# Patient Record
Sex: Male | Born: 1955 | Race: White | Hispanic: No | Marital: Single | Smoking: Never smoker
Health system: Southern US, Community
[De-identification: ages and names within clinical notes are randomized; demographics above are authoritative.]

## PROBLEM LIST (undated history)

## (undated) DIAGNOSIS — K219 Gastro-esophageal reflux disease without esophagitis: Secondary | ICD-10-CM

## (undated) DIAGNOSIS — M5416 Radiculopathy, lumbar region: Secondary | ICD-10-CM

## (undated) DIAGNOSIS — E039 Hypothyroidism, unspecified: Secondary | ICD-10-CM

## (undated) DIAGNOSIS — F329 Major depressive disorder, single episode, unspecified: Secondary | ICD-10-CM

## (undated) DIAGNOSIS — I10 Essential (primary) hypertension: Secondary | ICD-10-CM

## (undated) DIAGNOSIS — G40909 Epilepsy, unspecified, not intractable, without status epilepticus: Secondary | ICD-10-CM

## (undated) DIAGNOSIS — I872 Venous insufficiency (chronic) (peripheral): Secondary | ICD-10-CM

## (undated) DIAGNOSIS — E119 Type 2 diabetes mellitus without complications: Secondary | ICD-10-CM

## (undated) DIAGNOSIS — M419 Scoliosis, unspecified: Secondary | ICD-10-CM

## (undated) DIAGNOSIS — M791 Myalgia, unspecified site: Secondary | ICD-10-CM

## (undated) DIAGNOSIS — F32A Depression, unspecified: Secondary | ICD-10-CM

## (undated) DIAGNOSIS — M1990 Unspecified osteoarthritis, unspecified site: Secondary | ICD-10-CM

## (undated) DIAGNOSIS — F419 Anxiety disorder, unspecified: Secondary | ICD-10-CM

## (undated) DIAGNOSIS — G4730 Sleep apnea, unspecified: Secondary | ICD-10-CM

## (undated) DIAGNOSIS — E876 Hypokalemia: Secondary | ICD-10-CM

## (undated) DIAGNOSIS — R0602 Shortness of breath: Secondary | ICD-10-CM

## (undated) DIAGNOSIS — R0600 Dyspnea, unspecified: Secondary | ICD-10-CM

## (undated) DIAGNOSIS — R748 Abnormal levels of other serum enzymes: Secondary | ICD-10-CM

## (undated) HISTORY — DX: Unspecified osteoarthritis, unspecified site: M19.90

## (undated) HISTORY — DX: Shortness of breath: R06.02

## (undated) HISTORY — DX: Myalgia, unspecified site: M79.10

## (undated) HISTORY — DX: Hypothyroidism, unspecified: E03.9

## (undated) HISTORY — DX: Gastro-esophageal reflux disease without esophagitis: K21.9

## (undated) HISTORY — DX: Abnormal levels of other serum enzymes: R74.8

## (undated) HISTORY — PX: KNEE SURGERY: SHX244

## (undated) HISTORY — DX: Scoliosis, unspecified: M41.9

## (undated) HISTORY — DX: Depression, unspecified: F32.A

## (undated) HISTORY — DX: Major depressive disorder, single episode, unspecified: F32.9

## (undated) HISTORY — DX: Essential (primary) hypertension: I10

## (undated) HISTORY — DX: Morbid (severe) obesity due to excess calories: E66.01

## (undated) HISTORY — DX: Radiculopathy, lumbar region: M54.16

## (undated) HISTORY — DX: Hypokalemia: E87.6

## (undated) HISTORY — PX: TONSILLECTOMY: SUR1361

## (undated) HISTORY — PX: VENTRAL HERNIA REPAIR: SHX424

## (undated) HISTORY — DX: Venous insufficiency (chronic) (peripheral): I87.2

## (undated) HISTORY — PX: BRAIN SURGERY: SHX531

## (undated) HISTORY — DX: Epilepsy, unspecified, not intractable, without status epilepticus: G40.909

## (undated) HISTORY — DX: Sleep apnea, unspecified: G47.30

---

## 2000-07-07 DIAGNOSIS — J309 Allergic rhinitis, unspecified: Secondary | ICD-10-CM | POA: Insufficient documentation

## 2002-07-28 DIAGNOSIS — M5137 Other intervertebral disc degeneration, lumbosacral region: Secondary | ICD-10-CM | POA: Insufficient documentation

## 2002-07-28 DIAGNOSIS — M4120 Other idiopathic scoliosis, site unspecified: Secondary | ICD-10-CM | POA: Insufficient documentation

## 2002-07-28 DIAGNOSIS — M51379 Other intervertebral disc degeneration, lumbosacral region without mention of lumbar back pain or lower extremity pain: Secondary | ICD-10-CM | POA: Insufficient documentation

## 2002-08-26 DIAGNOSIS — F419 Anxiety disorder, unspecified: Secondary | ICD-10-CM | POA: Insufficient documentation

## 2004-07-30 DIAGNOSIS — R748 Abnormal levels of other serum enzymes: Secondary | ICD-10-CM | POA: Insufficient documentation

## 2004-08-13 DIAGNOSIS — E876 Hypokalemia: Secondary | ICD-10-CM | POA: Insufficient documentation

## 2007-10-20 DIAGNOSIS — I872 Venous insufficiency (chronic) (peripheral): Secondary | ICD-10-CM | POA: Insufficient documentation

## 2008-03-18 DIAGNOSIS — R109 Unspecified abdominal pain: Secondary | ICD-10-CM | POA: Insufficient documentation

## 2008-10-18 ENCOUNTER — Encounter: Admission: RE | Admit: 2008-10-18 | Discharge: 2008-10-18 | Payer: Self-pay | Admitting: Podiatry

## 2009-02-01 DIAGNOSIS — M7750 Other enthesopathy of unspecified foot: Secondary | ICD-10-CM | POA: Insufficient documentation

## 2009-08-31 DIAGNOSIS — Z8719 Personal history of other diseases of the digestive system: Secondary | ICD-10-CM | POA: Insufficient documentation

## 2009-09-04 DIAGNOSIS — E349 Endocrine disorder, unspecified: Secondary | ICD-10-CM | POA: Insufficient documentation

## 2009-09-29 DIAGNOSIS — M2550 Pain in unspecified joint: Secondary | ICD-10-CM | POA: Insufficient documentation

## 2009-10-26 DIAGNOSIS — M797 Fibromyalgia: Secondary | ICD-10-CM | POA: Insufficient documentation

## 2010-01-22 ENCOUNTER — Encounter: Admission: RE | Admit: 2010-01-22 | Discharge: 2010-01-22 | Payer: Self-pay | Admitting: Rheumatology

## 2010-02-19 ENCOUNTER — Encounter: Admission: RE | Admit: 2010-02-19 | Discharge: 2010-02-19 | Payer: Self-pay | Admitting: Rheumatology

## 2010-03-12 ENCOUNTER — Encounter: Admission: RE | Admit: 2010-03-12 | Discharge: 2010-03-12 | Payer: Self-pay | Admitting: Rheumatology

## 2010-03-16 DIAGNOSIS — G8929 Other chronic pain: Secondary | ICD-10-CM | POA: Insufficient documentation

## 2010-03-23 ENCOUNTER — Encounter: Payer: Self-pay | Admitting: Pulmonary Disease

## 2010-04-18 DIAGNOSIS — E039 Hypothyroidism, unspecified: Secondary | ICD-10-CM | POA: Insufficient documentation

## 2010-04-19 ENCOUNTER — Ambulatory Visit: Payer: Self-pay | Admitting: Pulmonary Disease

## 2010-04-19 DIAGNOSIS — R030 Elevated blood-pressure reading, without diagnosis of hypertension: Secondary | ICD-10-CM | POA: Insufficient documentation

## 2010-04-19 DIAGNOSIS — G4733 Obstructive sleep apnea (adult) (pediatric): Secondary | ICD-10-CM | POA: Insufficient documentation

## 2010-05-08 ENCOUNTER — Telehealth: Payer: Self-pay | Admitting: Pulmonary Disease

## 2010-05-29 ENCOUNTER — Encounter
Admission: RE | Admit: 2010-05-29 | Discharge: 2010-05-29 | Payer: Self-pay | Source: Home / Self Care | Attending: Rheumatology | Admitting: Rheumatology

## 2010-06-24 ENCOUNTER — Encounter: Payer: Self-pay | Admitting: Rheumatology

## 2010-07-03 NOTE — Progress Notes (Signed)
Summary: Dr Isidore Moos needing RX faxed  Phone Note From Other Clinic   Caller: Dr. Althea Grimmer office Call For: sood Summary of Call: Office called-they need RX for oral appliance faxed to them at [phone] sheet in EMR will not work). Office number to call if any questions [phone]. Pt has appt on 05-14-10 at 10:50am.  Initial call taken by: Reynaldo Minium CMA,  May 08, 2010 12:48 PM  Follow-up for Phone Call        they stated they rec. EMR order sheet but they require a written order for ins purpose. Follow-up by: Philipp Deputy CMA,  May 08, 2010 3:50 PM  Additional Follow-up for Phone Call Additional follow up Details #1::        Referral pulled out and re-faxed to Dr. Myrtis Ser along with ov notes and sleep study to [phone]. Alfonso Ramus  May 08, 2010 5:45 PM

## 2010-07-03 NOTE — Assessment & Plan Note (Signed)
Summary: PER [facility]/SLEEP CON/[facility]   Visit Type:  Initial Consult Copy to:  Leodis Sias, M.D. Primary Provider/Referring Provider:  Dr. Kathlee Nations in [place]  CC:  Sleep consult.Marland KitchenMarland KitchenEpworth score is 2..  History of Present Illness: 55 yo male referred for evaluation of sleep apnea.  He had a sleep test in 2003, and was told he had sleep apnea.  He was successful in losing weight, and his sleep apnea improved.  He has since regained his weight, and he started getting more trouble with his sleep again.  He had a home sleep test on October 19 through October 21, and this showed moderate sleep apnea.  He works second shift.  He goes to bed at 1am, and falls asleep quickly.  He has more trouble sleeping on his back.  If he sleeps with his head elevated this helps.  He wakes up to use the bathroom once per night.  He gets out of bed at 11am.  He will get a choking sensation when he sleeps flat.  He feels okay in the morning, and denies headaches.  He does get back and right leg pain which sometimes causes trouble with his sleep.  He does not use anything to help him sleep or stay awake.  He denies sleep hallucinations, sleep paralysis, or cataplexy.  There is no history of sleep walking, sleep talking, nightmares, or bruxism.  He occasional feels depressed, and is on medication for this.  Echo from September 14, 2009 showed mild LVH, mild pulmonary hypertension.  Preventive Screening-Counseling & Management  Alcohol-Tobacco     Smoking Status: never  Current Medications (verified): 1)  Diclofenac Sodium 75 Mg Tbec (Diclofenac Sodium) .Marland Kitchen.. 1 By Mouth Three Times A Day As Needed 2)  Oxcarbazepine 300 Mg Tabs (Oxcarbazepine) .Marland Kitchen.. 1 By Mouth Two Times A Day 3)  Levothyroxine Sodium 75 Mcg Tabs (Levothyroxine Sodium) .Marland Kitchen.. 1 By Mouth Daily 4)  Androgel 25 Mg/2.5gm Gel (Testosterone) .... As Directed Once Daily 5)  Hydrocodone-Acetaminophen 5-325 Mg Tabs (Hydrocodone-Acetaminophen) .Marland Kitchen.. 1 By  Mouth Two Times A Day As Needed 6)  Sertraline Hcl 50 Mg Tabs (Sertraline Hcl) .Marland Kitchen.. 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   Review of Systems       The patient complains of shortness of breath with activity, weight change, anxiety, depression, and joint stiffness or pain.  The patient denies shortness of breath at rest, productive cough, non-productive cough, coughing up blood, chest pain, irregular heartbeats, acid heartburn, indigestion, loss of appetite, abdominal pain, difficulty swallowing, sore throat, tooth/dental problems, headaches, nasal congestion/difficulty breathing through nose, sneezing, itching, ear ache, hand/feet swelling, rash, change in color of mucus, and fever.    Vital Signs:  Patient profile:   55 year old male Height:      69 inches (175.26 cm) Weight:      259 pounds (117.73 kg) BMI:     38.39 O2 Sat:      97 % on Room air Temp:     98.1 degrees F (36.72 degrees C) oral Pulse rate:   58 / minute BP sitting:   174 / 82 Cuff size:   large  Vitals Entered By: Michel Bickers CMA (April 19, 2010 3:35 PM)  O2 Sat at Rest %:  97 O2 Flow:  Room air CC: Sleep consult.Marland KitchenMarland KitchenEpworth score is 2. Comments Medications reviewed with patient Michel Bickers [facility]  April 19, 2010 3:36 PM   Physical Exam  General:  healthy appearing and obese.   Eyes:  PERRLA and EOMI.   Nose:  no deformity, discharge, inflammation, or lesions Mouth:  MP 3, no exudate Neck:  no JVD.   Lungs:  clear bilaterally to auscultation and percussion Heart:  regular rate and rhythm, S1, S2 without murmurs, rubs, gallops, or clicks Abdomen:  bowel sounds positive; abdomen soft and  non-tender without masses, or organomegaly Pulses:  pulses normal Extremities:  no clubbing, cyanosis, edema, or deformity noted Neurologic:  normal CN II-XII and strength normal.   Cervical Nodes:  no significant adenopathy Psych:  alert and cooperative; normal mood and affect; normal attention span and concentration   Impression & Recommendations:  Problem # 1:  OBSTRUCTIVE SLEEP APNEA (ICD-327.23) He has moderate sleep apnea. He has sleep disruption, daytime sleepiness, and history of depression.  I explained how sleep apnea can affect his health.  Driving precautions and need for weight loss were reviewed.  Discussed treatment options for sleep apnea.  He has tried CPAP before, but was not able to adjust to using this.    He therefore arrange for evaluation by Dr. Althea Grimmer to assess for an oral appliance to treat his obstructive sleep apnea.  I explained that he will need follow up sleep testing once his device is fitted.  Problem # 2:  ELEVATED BLOOD PRESSURE (ICD-796.2) He had elevated blood pressure today, and his recent echo showed mild LVH.  Will defer to cardiology and primary care whether medication is required to control his blood pressure.  Medications Added to Medication List This Visit: 1)  Diclofenac Sodium 75 Mg Tbec (Diclofenac sodium) .Marland Kitchen.. 1 by mouth three times a day as needed 2)  Oxcarbazepine 300 Mg Tabs (Oxcarbazepine) .Marland Kitchen.. 1 by mouth two times a day 3)  Levothyroxine Sodium 75 Mcg Tabs (Levothyroxine sodium) .Marland Kitchen.. 1 by mouth daily 4)  Androgel 25 Mg/2.5gm Gel (Testosterone) .... As directed once daily 5)  Hydrocodone-acetaminophen 5-325 Mg Tabs (Hydrocodone-acetaminophen) .Marland Kitchen.. 1 by mouth two times a day as needed 6)  Sertraline Hcl 50 Mg Tabs (Sertraline hcl) .Marland Kitchen.. 1 by mouth daily  Complete Medication List: 1)  Diclofenac Sodium 75 Mg Tbec (Diclofenac sodium) .Marland Kitchen.. 1 by mouth three times a day as needed 2)  Oxcarbazepine 300 Mg Tabs (Oxcarbazepine) .Marland Kitchen.. 1 by mouth  two times a day 3)  Levothyroxine Sodium 75 Mcg Tabs (Levothyroxine sodium) .Marland Kitchen.. 1 by mouth daily 4)  Androgel 25 Mg/2.5gm Gel (Testosterone) .... As directed once daily 5)  Hydrocodone-acetaminophen 5-325 Mg Tabs (Hydrocodone-acetaminophen) .Marland Kitchen.. 1 by mouth two times a day as needed 6)  Sertraline Hcl 50 Mg Tabs (Sertraline hcl) .Marland Kitchen.. 1 by mouth daily  Other Orders: Consultation Level IV (40102) Dental Referral (Dentist)  Patient Instructions: 1)  Will arrange for dental evaluation to fit for oral appliance for sleep apnea 2)  Follow up in 3 to 4 months

## 2010-10-22 ENCOUNTER — Other Ambulatory Visit: Payer: Self-pay | Admitting: Rheumatology

## 2010-10-22 DIAGNOSIS — M5410 Radiculopathy, site unspecified: Secondary | ICD-10-CM

## 2010-11-05 ENCOUNTER — Other Ambulatory Visit: Payer: Self-pay

## 2010-11-08 ENCOUNTER — Ambulatory Visit
Admission: RE | Admit: 2010-11-08 | Discharge: 2010-11-08 | Disposition: A | Discharge status: Home / Self Care | Payer: BC Managed Care – PPO | Source: Ambulatory Visit | Attending: Rheumatology | Admitting: Rheumatology

## 2010-11-08 DIAGNOSIS — M5410 Radiculopathy, site unspecified: Secondary | ICD-10-CM

## 2011-01-24 ENCOUNTER — Other Ambulatory Visit: Payer: Self-pay | Admitting: Rheumatology

## 2011-01-24 DIAGNOSIS — M5410 Radiculopathy, site unspecified: Secondary | ICD-10-CM

## 2011-01-31 ENCOUNTER — Other Ambulatory Visit: Payer: BC Managed Care – PPO

## 2011-02-11 ENCOUNTER — Other Ambulatory Visit: Payer: BC Managed Care – PPO

## 2011-02-14 ENCOUNTER — Other Ambulatory Visit: Payer: BC Managed Care – PPO

## 2011-07-01 ENCOUNTER — Inpatient Hospital Stay: Admission: RE | Admit: 2011-07-01 | Payer: BC Managed Care – PPO | Source: Ambulatory Visit

## 2011-07-11 ENCOUNTER — Ambulatory Visit
Admission: RE | Admit: 2011-07-11 | Discharge: 2011-07-11 | Disposition: A | Discharge status: Home / Self Care | Payer: BC Managed Care – PPO | Source: Ambulatory Visit | Attending: Rheumatology | Admitting: Rheumatology

## 2011-07-11 DIAGNOSIS — M5410 Radiculopathy, site unspecified: Secondary | ICD-10-CM

## 2011-07-15 ENCOUNTER — Other Ambulatory Visit: Payer: BC Managed Care – PPO

## 2011-09-09 ENCOUNTER — Encounter: Payer: Self-pay | Admitting: Cardiovascular Disease

## 2011-09-26 ENCOUNTER — Encounter: Payer: Self-pay | Admitting: *Deleted

## 2011-09-27 ENCOUNTER — Encounter: Payer: Self-pay | Admitting: Cardiovascular Disease

## 2011-09-27 ENCOUNTER — Ambulatory Visit (INDEPENDENT_AMBULATORY_CARE_PROVIDER_SITE_OTHER): Payer: BC Managed Care – PPO | Admitting: Cardiovascular Disease

## 2011-09-27 VITALS — BP 132/70 | HR 60 | Ht 69.0 in | Wt 260.1 lb

## 2011-09-27 DIAGNOSIS — R030 Elevated blood-pressure reading, without diagnosis of hypertension: Secondary | ICD-10-CM

## 2011-09-27 DIAGNOSIS — G4733 Obstructive sleep apnea (adult) (pediatric): Secondary | ICD-10-CM

## 2011-09-27 NOTE — Assessment & Plan Note (Signed)
His blood pressure is fairly well controlled at present. I've encouraged him to work on getting his sleep apnea corrected. This will certainly help with his blood pressure. It will also help with his fibromyalgia pain. I've asked him to work on a good diet and exercise program.

## 2011-09-27 NOTE — Progress Notes (Signed)
    Isaac Lozano Date of Birth  1955-07-04 [facility]     [place]  [address]    [address]   [address] [place], [place]  [address]    [place], [place]  [address] [phone]  Fax  [phone]  [phone]  Fax [phone]  Problems 1. Hypothyroidism 2. Sleep apnea- does not wear CPAP 3. dysnea 4. Obesity 5. "fibromyalgia"   History of Present Illness:  He presents with cp - typically when he bends over..  He has total body aches - c/w fibromyalgia symptoms.  He walks on occasion and has typical muscular soreness. He does not have any angina when he walks.  He has sleep apnea but does not tolerate his CPAP mask. He is looking to get one of the mouthpieces that will help to his jaw forward.   Current Outpatient Prescriptions on File Prior to Visit  Medication Sig Dispense Refill  . B Complex Vitamins (VITAMIN B COMPLEX PO) Take by mouth daily.      . Cholecalciferol (VITAMIN D PO) Take by mouth daily.      Marland Kitchen DICLOFENAC POTASSIUM PO Take by mouth as needed.      Marland Kitchen levothyroxine (SYNTHROID, LEVOTHROID) 75 MCG tablet Take 75 mcg by mouth daily.      Marland Kitchen omeprazole (PRILOSEC) 20 MG capsule Take 20 mg by mouth daily.      . Oxcarbazepine (TRILEPTAL) 300 MG tablet       . sertraline (ZOLOFT) 50 MG tablet         No Known Allergies  Past Medical History  Diagnosis Date  . SOB (shortness of breath)     Chronic   . Elevated CPK   . Muscle pain     Chronic  . Hypothyroidism   . Sleep apnea   . Depression   . HTN (hypertension)   . Seizure disorder   . Scoliosis   . GERD (gastroesophageal reflux disease)   . Hypokalemia   . Low testosterone   . Venous insufficiency     Past Surgical History  Procedure Date  . Knee surgery     Left  . Brain surgery   . Ventral hernia repair     History  Smoking status  . Never Smoker   Smokeless tobacco  . Not on file    History  Alcohol Use No    No family history on file.  Reviw of  Systems:  Reviewed in the HPI.  All other systems are negative.  Physical Exam: Blood pressure 132/70, pulse 60, height 5\' 9"  (1.753 m), weight 260 lb 1.9 oz (117.99 kg). General: Well developed, well nourished, in no acute distress.  Head: Normocephalic, atraumatic, sclera non-icteric, mucus membranes are moist,   Neck: Supple. Carotids are 2 + without bruits. No JVD  Lungs: Clear bilaterally to auscultation.  Heart: regular rate.  normal  S1 S2. No murmurs, gallops or rubs.  Abdomen: Soft, non-tender, non-distended with normal bowel sounds. No hepatomegaly. No rebound/guarding. No masses.  He is moderately obese.  Msk:  Strength and tone are normal  Extremities: No clubbing or cyanosis. No edema.  Distal pedal pulses are 2+ and equal bilaterally.  Neuro: Alert and oriented X 3. Moves all extremities spontaneously.  Psych:  Responds to questions appropriately with a normal affect.  ECG: Assessment / Plan:

## 2011-09-27 NOTE — Assessment & Plan Note (Signed)
I have advised him to see his pulmonologist. I think that he needs better control of his sleep apnea.

## 2011-09-27 NOTE — Patient Instructions (Signed)
Your physician wants you to follow-up in: 6 months  You will receive a reminder letter in the mail two months in advance. If you don't receive a letter, please call our office to schedule the follow-up appointment.  Your physician recommends that you return for a FASTING lipid profile: 6 months   

## 2011-11-27 ENCOUNTER — Other Ambulatory Visit: Payer: Self-pay | Admitting: Rheumatology

## 2011-11-27 DIAGNOSIS — M79651 Pain in right thigh: Secondary | ICD-10-CM

## 2011-11-27 DIAGNOSIS — M549 Dorsalgia, unspecified: Secondary | ICD-10-CM

## 2011-12-12 ENCOUNTER — Ambulatory Visit
Admission: RE | Admit: 2011-12-12 | Discharge: 2011-12-12 | Disposition: A | Discharge status: Home / Self Care | Payer: BC Managed Care – PPO | Source: Ambulatory Visit | Attending: Rheumatology | Admitting: Rheumatology

## 2011-12-12 VITALS — BP 159/83 | HR 58

## 2011-12-12 DIAGNOSIS — M549 Dorsalgia, unspecified: Secondary | ICD-10-CM

## 2011-12-12 DIAGNOSIS — M5126 Other intervertebral disc displacement, lumbar region: Secondary | ICD-10-CM

## 2011-12-12 DIAGNOSIS — M79651 Pain in right thigh: Secondary | ICD-10-CM

## 2011-12-12 MED ORDER — IOHEXOL 180 MG/ML  SOLN
1.0000 mL | Freq: Once | INTRAMUSCULAR | Status: AC | PRN
  Administered 2011-12-12: 1 mL via EPIDURAL

## 2011-12-12 MED ORDER — METHYLPREDNISOLONE ACETATE 40 MG/ML INJ SUSP (RADIOLOG
120.0000 mg | Freq: Once | INTRAMUSCULAR | Status: AC
  Administered 2011-12-12: 120 mg via EPIDURAL

## 2011-12-12 NOTE — Progress Notes (Addendum)
Pt was given his CD with MRI on it.

## 2012-01-31 ENCOUNTER — Other Ambulatory Visit: Payer: Self-pay | Admitting: Rheumatology

## 2012-01-31 DIAGNOSIS — M545 Low back pain, unspecified: Secondary | ICD-10-CM

## 2012-02-12 ENCOUNTER — Ambulatory Visit
Admission: RE | Admit: 2012-02-12 | Discharge: 2012-02-12 | Disposition: A | Discharge status: Home / Self Care | Payer: BC Managed Care – PPO | Source: Ambulatory Visit | Attending: Rheumatology | Admitting: Rheumatology

## 2012-02-12 DIAGNOSIS — M545 Low back pain, unspecified: Secondary | ICD-10-CM

## 2012-02-19 ENCOUNTER — Other Ambulatory Visit: Payer: Self-pay | Admitting: Rheumatology

## 2012-02-19 DIAGNOSIS — M4800 Spinal stenosis, site unspecified: Secondary | ICD-10-CM

## 2012-02-25 ENCOUNTER — Other Ambulatory Visit: Payer: BC Managed Care – PPO

## 2012-02-25 ENCOUNTER — Ambulatory Visit
Admission: RE | Admit: 2012-02-25 | Discharge: 2012-02-25 | Disposition: A | Discharge status: Home / Self Care | Payer: BC Managed Care – PPO | Source: Ambulatory Visit | Attending: Rheumatology | Admitting: Rheumatology

## 2012-02-25 DIAGNOSIS — M4800 Spinal stenosis, site unspecified: Secondary | ICD-10-CM

## 2012-02-25 MED ORDER — METHYLPREDNISOLONE ACETATE 40 MG/ML INJ SUSP (RADIOLOG
120.0000 mg | Freq: Once | INTRAMUSCULAR | Status: AC
  Administered 2012-02-25: 120 mg via EPIDURAL

## 2012-02-25 MED ORDER — IOHEXOL 180 MG/ML  SOLN
1.0000 mL | Freq: Once | INTRAMUSCULAR | Status: AC | PRN
  Administered 2012-02-25: 1 mL via EPIDURAL

## 2012-06-09 DIAGNOSIS — R609 Edema, unspecified: Secondary | ICD-10-CM | POA: Insufficient documentation

## 2012-06-11 ENCOUNTER — Other Ambulatory Visit: Payer: Self-pay | Admitting: Internal Medicine

## 2012-06-11 DIAGNOSIS — M7989 Other specified soft tissue disorders: Secondary | ICD-10-CM

## 2012-06-15 ENCOUNTER — Ambulatory Visit
Admission: RE | Admit: 2012-06-15 | Discharge: 2012-06-15 | Disposition: A | Discharge status: Home / Self Care | Payer: BC Managed Care – PPO | Source: Ambulatory Visit | Attending: Internal Medicine | Admitting: Internal Medicine

## 2012-06-15 DIAGNOSIS — M7989 Other specified soft tissue disorders: Secondary | ICD-10-CM

## 2012-07-02 ENCOUNTER — Other Ambulatory Visit: Payer: Self-pay | Admitting: Rheumatology

## 2012-07-02 DIAGNOSIS — M542 Cervicalgia: Secondary | ICD-10-CM

## 2012-07-05 ENCOUNTER — Other Ambulatory Visit: Payer: BC Managed Care – PPO

## 2012-07-06 ENCOUNTER — Ambulatory Visit
Admission: RE | Admit: 2012-07-06 | Discharge: 2012-07-06 | Disposition: A | Discharge status: Home / Self Care | Payer: BC Managed Care – PPO | Source: Ambulatory Visit | Attending: Rheumatology | Admitting: Rheumatology

## 2012-07-06 DIAGNOSIS — M542 Cervicalgia: Secondary | ICD-10-CM

## 2012-07-07 DIAGNOSIS — M47812 Spondylosis without myelopathy or radiculopathy, cervical region: Secondary | ICD-10-CM | POA: Insufficient documentation

## 2012-07-07 DIAGNOSIS — M5090 Cervical disc disorder, unspecified, unspecified cervical region: Secondary | ICD-10-CM | POA: Insufficient documentation

## 2012-07-08 ENCOUNTER — Other Ambulatory Visit: Payer: BC Managed Care – PPO

## 2012-07-15 ENCOUNTER — Other Ambulatory Visit: Payer: Self-pay | Admitting: Rheumatology

## 2012-07-15 DIAGNOSIS — I739 Peripheral vascular disease, unspecified: Secondary | ICD-10-CM

## 2012-07-15 DIAGNOSIS — R52 Pain, unspecified: Secondary | ICD-10-CM

## 2012-07-20 ENCOUNTER — Other Ambulatory Visit: Payer: BC Managed Care – PPO

## 2012-07-22 ENCOUNTER — Ambulatory Visit
Admission: RE | Admit: 2012-07-22 | Discharge: 2012-07-22 | Disposition: A | Discharge status: Home / Self Care | Payer: BC Managed Care – PPO | Source: Ambulatory Visit | Attending: Rheumatology | Admitting: Rheumatology

## 2012-07-22 DIAGNOSIS — I739 Peripheral vascular disease, unspecified: Secondary | ICD-10-CM

## 2012-07-22 DIAGNOSIS — R52 Pain, unspecified: Secondary | ICD-10-CM

## 2013-06-01 ENCOUNTER — Ambulatory Visit: Payer: Self-pay

## 2013-06-25 ENCOUNTER — Encounter: Payer: Self-pay | Admitting: Neurology

## 2013-06-28 ENCOUNTER — Encounter: Payer: Self-pay | Admitting: Neurology

## 2013-06-28 ENCOUNTER — Ambulatory Visit (INDEPENDENT_AMBULATORY_CARE_PROVIDER_SITE_OTHER): Payer: BC Managed Care – PPO | Admitting: Neurology

## 2013-06-28 ENCOUNTER — Encounter (INDEPENDENT_AMBULATORY_CARE_PROVIDER_SITE_OTHER): Payer: Self-pay

## 2013-06-28 VITALS — BP 147/81 | HR 62 | Ht 68.0 in | Wt 253.0 lb

## 2013-06-28 DIAGNOSIS — M545 Low back pain, unspecified: Secondary | ICD-10-CM

## 2013-06-28 DIAGNOSIS — R569 Unspecified convulsions: Secondary | ICD-10-CM | POA: Insufficient documentation

## 2013-06-28 NOTE — Progress Notes (Signed)
[facility]    Provider:  Dr Hosie PoissonSumner Referring Provider: Gary FleetMadonia, Eugene, MD Primary Care Physician:  Kathlee NationsEASON,PAUL, MD  CC:  Seizure disorder  HPI:  Isaac Lozano is a 58 y.o. male here as a referral from Dr. Elenora FenderMadonia for seizure disorder.   Currently followed by Dr Elenora FenderMadonia for seizures, he is retiring so patient is transitioning care. Had brain surgery in 1991 for seizures, states the intervention involved the left temporal lobe. 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 Has not had a seizure since prior to surgery in 1991. He will occasionally have auras, can happen a few times a month. Aura described as a "clammy" sensation, some paresthesias in perioral region, some anxiety. Episodes last a few minutes and then resolve. He notices the symptoms more when he has a small cold or fever.   He is currently taking Trileptal 300mg  daily once a day. Instructed to take 2x a day if the aura increases. Doing this maybe once a month.   Otherwise healthy overall. Has a lot of joint/bone pain. Gets cortisol injections for lumbar degenerative disc disease. Takes diclofenac as needed for pain.  Takes a once a day vitamin, states it has vitamin D in it.   Review of Systems: Out of a complete 14 system review, the patient complains of only the following symptoms, and all other reviewed systems are negative. Positive for snoring fatigue  History   Social History  . Marital Status: Single    Spouse Name: N/A    Number of Children: 1  . Years of Education: Associates   Occupational History  .     Social History Main Topics  . Smoking status: Never Smoker   . Smokeless tobacco: Not on file  . Alcohol Use: No  . Drug Use: No  . Sexual Activity: Not on file   Other Topics Concern  . Not on file   Social History Narrative   Patient lives at home alone, has 1 child   Patient is right handed   Education level is Associates  degree   Caffeine consumption is 2-3 daily    Family History  Problem Relation Age of Onset  . Cancer Father     Past Medical History  Diagnosis Date  . SOB (shortness of breath)     Chronic   . Elevated CPK   . Muscle pain     Chronic  . Hypothyroidism   . Sleep apnea   . Depression   . HTN (hypertension)   . Seizure disorder   . Scoliosis   . GERD (gastroesophageal reflux disease)   . Hypokalemia   . Low testosterone   . Venous insufficiency   . Lumbar radiculopathy     right sided  . Osteoarthritis     both knees  . Morbid obesity   . Sleep apnea     Past Surgical History  Procedure Laterality Date  . Knee surgery      Left  . Brain surgery    . Ventral hernia repair      Current Outpatient Prescriptions  Medication Sig Dispense Refill  . diclofenac (VOLTAREN) 75 MG EC tablet       . levothyroxine (SYNTHROID, LEVOTHROID) 75 MCG tablet Take 75 mcg by mouth daily.      . Oxcarbazepine (TRILEPTAL) 300 MG tablet        No current facility-administered medications for this visit.    Allergies as of 06/28/2013  . (  No Known Allergies)    Vitals: BP 147/81  Pulse 62  Ht 5\' 8"  (1.727 m)  Wt 253 lb (114.76 kg)  BMI 38.48 kg/m2 Last Weight:  Wt Readings from Last 1 Encounters:  06/28/13 253 lb (114.76 kg)   Last Height:   Ht Readings from Last 1 Encounters:  06/28/13 5\' 8"  (1.727 m)     Physical exam: Exam: Gen: NAD, conversant Eyes: anicteric sclerae, moist conjunctivae HENT: Atraumatic, oropharynx clear Neck: Trachea midline; supple,  Lungs: CTA, no wheezing, rales, rhonic                          CV: RRR, no MRG Abdomen: Soft, non-tender;  Extremities: No peripheral edema  Skin: Normal temperature, no rash,  Psych: Appropriate affect, pleasant  Neuro: MS: AA&Ox3, appropriately interactive, normal affect   Speech: fluent w/o paraphasic error  Memory: good recent and remote recall  CN: PERRL, EOMI no nystagmus, no ptosis, sensation  intact to LT V1-V3 bilat, face symmetric, no weakness, hearing grossly intact, palate elevates symmetrically, shoulder shrug 5/5 bilat,  tongue protrudes midline, no fasiculations noted.  Motor: normal bulk and tone Strength: 5/5  In all extremities  Coord: rapid alternating and point-to-point (FNF, HTS) movements intact.  Reflexes: symmetrical, bilat downgoing toes  Sens: LT intact in all extremities  Gait: posture, stance, stride and arm-swing normal. Tandem gait intact. Able to walk on heels and toes. Romberg absent.   Assessment:  After physical and neurologic examination, review of laboratory studies, imaging, neurophysiology testing and pre-existing records, assessment will be reviewed on the problem list.  Plan:  Treatment plan and additional workup will be reviewed under Problem List.  1)Seizures 2)Lumbar back pain   58 year old gentleman with history of seizure disorder, status post surgery in 1991, presented for initial evaluation and transition of care as his neurologist is retiring. Last seizure was in 1991. He is currently taking Trileptal 300 mg daily. He occasional have aura symptoms but no full seizures. Tolerating medication well. He takes a daily vitamin, which he states has vitamin D in it. Physical exam is unremarkable. Will continue patient on current dose of Trileptal. Will check blood work at next visit. Followup in 6 months or earlier as needed. She is followed by surgery for his history chronic back pain, currently receives cortisone injections.   Elspeth Cho, DO  [facility] [address] [place], [place] [address]  Phone [phone] Fax [phone]

## 2013-06-28 NOTE — Patient Instructions (Addendum)
Overall you are doing fairly well but I do want to suggest a few things today:   As far as your medications are concerned, I would like to suggest making no changes to your medication regimen. Continue to take the Trileptal 300mg  once a day.  As far as diagnostic testing:  1)I ordered an EEG, please schedule this at your convenience.   You may benefit from following up with an endocrinologist for further evaluation of your low testosterone levels  I would like to see you back in 6 months, sooner if we need to. Please call us with any interim questions, concerns, problems, updates or refill requests.   Please also call us for any test results so we can go over those with you on the phone.  My clinical assistant and will answer any of your questions and relay your messages to me and also relay most of my messages to you.   Our phone number is [phone]. We also have an after hours call service for urgent matters and there is a physician on-call for urgent questions. For any emergencies you know to call 911 or go to the nearest emergency room

## 2013-06-29 ENCOUNTER — Telehealth: Payer: Self-pay | Admitting: Neurology

## 2013-06-29 NOTE — Telephone Encounter (Signed)
FYI

## 2013-06-29 NOTE — Telephone Encounter (Signed)
Patient returning call about scheduling EMG - Patient wants to wait until he has more aura symptoms. Patient will call us to schedule.

## 2013-06-29 NOTE — Telephone Encounter (Signed)
Left message for patient to call back and schedule routine EEG.

## 2013-07-23 ENCOUNTER — Telehealth: Payer: Self-pay | Admitting: Neurology

## 2013-07-23 MED ORDER — OXCARBAZEPINE 300 MG PO TABS: 300.0000 mg | ORAL_TABLET | Freq: Every day | ORAL | Status: DC

## 2013-07-23 NOTE — Telephone Encounter (Signed)
Short supply sent to [facility] and 90 day Rx sent to mail order.

## 2013-07-23 NOTE — Telephone Encounter (Signed)
Patient needs refill for Oxcarbazepine 300mg -[facility]-fax# [phone]--needs 5 pills called to [facility] until other received from [facility]--thank you.

## 2013-07-30 ENCOUNTER — Ambulatory Visit (INDEPENDENT_AMBULATORY_CARE_PROVIDER_SITE_OTHER): Payer: BC Managed Care – PPO

## 2013-07-30 VITALS — BP 172/96 | HR 68 | Resp 16

## 2013-07-30 DIAGNOSIS — M79609 Pain in unspecified limb: Secondary | ICD-10-CM

## 2013-07-30 DIAGNOSIS — M76829 Posterior tibial tendinitis, unspecified leg: Secondary | ICD-10-CM

## 2013-07-30 DIAGNOSIS — M7750 Other enthesopathy of unspecified foot: Secondary | ICD-10-CM

## 2013-07-30 DIAGNOSIS — M6789 Other specified disorders of synovium and tendon, multiple sites: Secondary | ICD-10-CM

## 2013-07-30 DIAGNOSIS — Q828 Other specified congenital malformations of skin: Secondary | ICD-10-CM

## 2013-07-30 DIAGNOSIS — M1990 Unspecified osteoarthritis, unspecified site: Secondary | ICD-10-CM

## 2013-07-30 NOTE — Progress Notes (Signed)
   Subjective:    Patient ID: Irena ReichmannWinfred Mallari, male    DOB: 05/16/1956, 58 y.o.   MRN: [id number]  HPI Comments: "I need a shot in my left ankle and trim the calluses on my right foot"  Foot Pain:  Follow up left foot and ankle  Calluses:  Medial sides - right great toe and heel right  Foot Pain   patient continues to have valgus deformity right foot with pes planus and collapse the medial arch and lateral deviation of the forefoot does uses there is a new brace but mostly for work or activities does not have it with him at this time.    Review of Systems no new changes or findings at this time.     Objective:   Physical Exam Neurovascular status appears to be intact with pedal pulses palpable DP and PT +2/4 bilateral capillary fill time 3 seconds epicritic and proprioceptive sensations intact and symmetric bilateral normal at DTRs not listed neurologically skin color pigment normal hair growth absent there is pain on palpation lateral sinus tarsi area lateral Lisfranc for fifth metatarsal base and cuboid on palpation and inversion eversion. 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 Patient does wear the brace at times and takes diclofenac on an as-needed basis for flareups and arthritis varus needs to continue to do so. There is no acute reversal pain on palpation or percussion at this time and I feel is a good candidate for additional steroid injections patient also asked her the possibility of a salvage procedure versus arthrodesis procedure could be done I advised him that if he had arthritis in the past is not likely the cartilage to grow back and then right now stabilizing on the external utilizing [place] brace is most beneficial thing the last alternative would be stabilizing on the inside regimen as arthrodesis or subtalar fusion is not likely that we change of debrided and rewrap a new MRI patient was  advised that Dimas AguasHoward he went to work for 5 years before he retires advised to continue the help with a coming shoes bracing NSAID therapy an and orthoses as needed continue with conservative course and surgical options for arthrodesis should be a last option resort       Assessment & Plan:  Assessment this time is 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 Will continue maintain [place] brace continue with diclofenac as an NSAID at this time patient also some keratoses pinch calluses of the hallux and medial right heel are debrided patient will continue using a callus debrider and put lotion on daily also advised she can use of a bike a salon Costain cream to help with his heel and arch and ankle pains. Recheck in 6 months for long-term followup  Alvan Dameichard Estie Sproule DPM

## 2013-07-30 NOTE — Patient Instructions (Signed)
ICE INSTRUCTIONS  Apply ice or cold pack to the affected area at least 3 times a day for 10-15 minutes each time.  You should also use ice after prolonged activity or vigorous exercise.  Do not apply ice longer than 20 minutes at one time.  Always keep a cloth between your skin and the ice pack to prevent burns.  Being consistent and following these instructions will help control your symptoms.  We suggest you purchase a gel ice pack because they are reusable and do bit leak.  Some of them are designed to wrap around the area.  Use the method that works best for you.  Here are some other suggestions for icing.   Use a frozen bag of peas or corn-inexpensive and molds well to your body, usually stays frozen for 10 to 20 minutes.  Wet a towel with cold water and squeeze out the excess until it's damp.  Place in a bag in the freezer for 20 minutes. Then remove and use. Alternate warm compress ice packs to the ankle and rear foot area for the persistent pain tendinitis  Maintain the [place] brace walker especially when working or doing any strenuous activities

## 2013-09-03 DIAGNOSIS — I491 Atrial premature depolarization: Secondary | ICD-10-CM | POA: Insufficient documentation

## 2013-10-08 ENCOUNTER — Ambulatory Visit: Payer: BC Managed Care – PPO

## 2013-10-08 VITALS — BP 142/71 | HR 66 | Resp 16

## 2013-10-08 DIAGNOSIS — Q828 Other specified congenital malformations of skin: Secondary | ICD-10-CM

## 2013-10-08 DIAGNOSIS — M79609 Pain in unspecified limb: Secondary | ICD-10-CM

## 2013-10-08 DIAGNOSIS — M1990 Unspecified osteoarthritis, unspecified site: Secondary | ICD-10-CM

## 2013-10-08 NOTE — Progress Notes (Signed)
   Subjective:    Patient ID: Irena ReichmannWinfred Singleterry, male    DOB: 03/25/1956, 58 y.o.   MRN: [id number]  HPI Comments: "I feel like this toe hurts more in the corner than down by the callus. Also, I guess he can check the ankle too today. It has its moments"  Ankle Pain:  Follow up right ankle Calluses:  Medial sides-1st toe and heel right     Review of Systems no systemic changes or findings     Objective:   Physical Exam Vascular status is intact pedal pulses palpable DP postal for PT one over 4 right foot patient has significant keratoses the hallux IP joint of previous partial nail excision with good results however his feet or pain and hemorrhage a keratosis of the IP joint also hemorrhage a keratosis posterior medial right heel area of both these areas are debrided at this time provide some relief the recommendation for A year topical urea cream and use a pumice stone. 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 No other new changes or findings noted       Assessment & Plan:  Assessment 4 keratoses secondary digital contractures last arthropathy the hallux as well as soak for a keratosis of the heel multiple keratotic lesions debrided recommended topical urea cream and future debridement on an as-needed basis for followup  Alvan Dameichard Jarmar Rousseau DPM

## 2013-10-08 NOTE — Patient Instructions (Signed)

## 2013-10-11 ENCOUNTER — Ambulatory Visit: Payer: BC Managed Care – PPO

## 2013-10-15 ENCOUNTER — Encounter: Payer: Self-pay | Admitting: Cardiovascular Disease

## 2013-10-28 ENCOUNTER — Ambulatory Visit (INDEPENDENT_AMBULATORY_CARE_PROVIDER_SITE_OTHER): Payer: BC Managed Care – PPO | Admitting: Cardiovascular Disease

## 2013-10-28 ENCOUNTER — Encounter: Payer: Self-pay | Admitting: Cardiovascular Disease

## 2013-10-28 VITALS — BP 143/84 | HR 62 | Ht 68.0 in | Wt 255.0 lb

## 2013-10-28 DIAGNOSIS — R002 Palpitations: Secondary | ICD-10-CM

## 2013-10-28 DIAGNOSIS — R030 Elevated blood-pressure reading, without diagnosis of hypertension: Secondary | ICD-10-CM

## 2013-10-28 NOTE — Assessment & Plan Note (Signed)
Isaac Lozano  presents for further evaluation of some palpitations. He has normal sinus rhythm with sinus arrhythmia on EKG today but his medical Dr. Did an EKG and  saw an arrhythmia that he  thought needed to be evaluated further. I  suspected he might have premature ventricular contractions. He is largely asymptomatic.  he does have lots of vague and non-specific complaints related to his chronic pain and fibromyalgia.  He will fax the EKG over to [place] or have that his doctor's office faxed the EKG over. I've advised him to eat more potassium. I've advised him to really work on a diet, exercise, and weight loss plan. I've given him a low glycemic index diet. 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  I have encouraged him to pick the more healthy foods at work.

## 2013-10-28 NOTE — Patient Instructions (Addendum)
Your physician wants you to follow-up in: 6 months with Dr. Elease Hashimoto.  You will receive a reminder letter in the mail two months in advance. If you don't receive a letter, please call our office to schedule the follow-up appointment.   [facility] Low Glycemic Diet (Source: [facility], 2006) Low Glycemic Foods (20-49) (Decrease risk of developing heart disease) Breakfast Cereals: All-Bran All-Bran Fruit 'n Oats Fiber One Oatmeal (not instant) Oat bran Fruits and fruit juices: (Limit to 1-2 servings per day) Apples Apricots (fresh & dried) Blackberries Blueberries Cherries Cranberries Peaches Pears Plums Prunes Grapefruit Raspberries Strawberries Tangerine Apple juice Grapefruit juice Tomato juice Beans and legumes (fresh-cooked): Black-eyed peas Butter beans Chick peas Lentils  Green beans Lima beans Kidney beans Navy beans Pinto beans Snow peas Non-starchy vegetables: Asparagus, avocado, broccoli, cabbage, cauliflower, celery, cucumber, greens, lettuce, mushrooms, peppers, tomatoes, okra, onions, spinach, summer squash Grains: Barley Bulgur Rye Wild rice Nuts and oils : Almonds Peanuts Sunflower seeds Hazelnuts Pecans Walnuts Oils that are liquid at room temperature Dairy, fish, meat, soy, and eggs: Milk, skim Lowfat cheese Yogurt, lowfat, fruit sugar sweetened Lean red meat Fish  Skinless chicken & [place] Shellfish Egg whites (up to 3 daily) Soy products  Egg yolks (up to 7 or _____ per week) Moderate Glycemic Foods (50-69) Breakfast Cereals: Bran Buds Bran Chex Just Right Mini-Wheats  Special K Swiss muesli Fruits: Banana (under-ripe) Dates Figs Grapes Kiwi Mango Oranges Raisins Fruit Juices: Cranberry juice Orange juice Beans and legumes: Boston-type baked beans Canned pinto, kidney, or navy beans Green peas Vegetables: Beets Carrots  Sweet potato Yam Corn on the cob Breads: Pita (pocket) bread Oat bran bread Pumpernickel bread  Rye bread Wheat bread, high fiber  Grains: Cornmeal Rice, brown Rice, white Couscous Pasta: Macaroni Pizza, cheese Ravioli, meat filled Spaghetti, white  Nuts: Cashews Macadamia Snacks: Chocolate Ice cream, lowfat Muffin Popcorn High Glycemic Foods (70-100)  Breakfast Cereals: Cheerios Corn Chex Corn Flakes Cream of Wheat Grape Nuts Grape Nut Flakes Grits Nutri-Grain Puffed Rice Puffed Wheat Rice Chex Rice Krispies Shredded Wheat Team Total Fruits: Pineapple Watermelon Banana (over-ripe) Beverages: Sodas, sweet tea, pineapple juice Vegetables: Potato, baked, boiled, fried, mashed Jamaica fries Canned or frozen corn Parsnips Winter squash Breads: Most breads (white and whole grain) Bagels Bread sticks Bread stuffing Kaiser roll Dinner rolls Grains: Rice, instant Tapioca, with milk Candy and most cookies Snacks: Donuts Corn chips Jelly beans Pretzels Pastries

## 2013-10-28 NOTE — Assessment & Plan Note (Signed)
His blood pressure remains mildly elevated/upper limits of normal. I've encouraged him to work on a diet and exercise and weight loss program. I've also encouraged him to work on his sleep apnea

## 2013-10-28 NOTE — Progress Notes (Signed)
Isaac Lozano Date of Birth  1956/01/05 [facility]     [place]  [address]    [address]   [address] [place], [place]  [address]    [place], [place]  [address] [phone]  Fax  [phone]  [phone]  Fax [phone]  Problems 1. Hypothyroidism 2. Sleep apnea- does not wear CPAP 3. dysnea 4. Obesity 5. "fibromyalgia" 6. Hypertension  History of Present Illness:  He presents with cp - typically when he bends over..  He has total body aches - c/w fibromyalgia symptoms.  He walks on occasion and has typical muscular soreness. He does not have any angina when he walks.  He has sleep apnea but does not tolerate his CPAP mask. He is looking to get one of the mouthpieces that will help to his jaw forward.  Oct 28, 2013:  [name] presents for follow up.  He was seen 2 years ago.  He has occasional palpitations.   He presents for further evaluation of these palpitations.    These sound like PVCs.  He still has not bought a CPAP mask or mouth piece to help with his sleep apnea.     He takes his BP on occasion ( at the drug store)  .  Readings are occasionally high.    Current Outpatient Prescriptions on File Prior to Visit  Medication Sig Dispense Refill  . acetaminophen (TYLENOL) 500 MG tablet Take 500 mg by mouth every 6 (six) hours as needed.      . diclofenac (VOLTAREN) 75 MG EC tablet       . levothyroxine (SYNTHROID, LEVOTHROID) 75 MCG tablet Take 75 mcg by mouth daily.      . Oxcarbazepine (TRILEPTAL) 300 MG tablet Take 1 tablet (300 mg total) by mouth daily.  90 tablet  1   No current facility-administered medications on file prior to visit.    No Known Allergies  Past Medical History  Diagnosis Date  . SOB (shortness of breath)     Chronic   . Elevated CPK   . Muscle pain     Chronic  . Hypothyroidism   . Sleep apnea   . Depression   . HTN (hypertension)   . Seizure disorder   . Scoliosis   . GERD (gastroesophageal reflux  disease)   . Hypokalemia   . Low testosterone   . Venous insufficiency   . Lumbar radiculopathy     right sided  . Osteoarthritis     both knees  . Morbid obesity   . Sleep apnea     Past Surgical History  Procedure Laterality Date  . Knee surgery      Left  . Brain surgery    . Ventral hernia repair      History  Smoking status  . Never Smoker   Smokeless tobacco  . Not on file    History  Alcohol Use No    Family History  Problem Relation Age of Onset  . Cancer Father     Reviw of Systems:  Reviewed in the HPI.  All other systems are negative.  Physical Exam: Blood pressure 143/84, pulse 62, height 5\' 8"  (1.727 m), weight 255 lb (115.667 kg). General: Well developed, well nourished, in no acute distress.  Head: Normocephalic, atraumatic, sclera non-icteric, mucus membranes are moist,   Neck: Supple. Carotids are 2 + without bruits. No JVD  Lungs: Clear bilaterally to auscultation.  Heart: regular rate.  normal  S1  S2. No murmurs, gallops or rubs.  Abdomen: Soft, non-tender, non-distended with normal bowel sounds. No hepatomegaly. No rebound/guarding. No masses.  He is moderately obese.  Msk:  Strength and tone are normal  Extremities: No clubbing or cyanosis. No edema.  Distal pedal pulses are 2+ and equal bilaterally.  Neuro: Alert and oriented X 3. Moves all extremities spontaneously.  Psych:  Responds to questions appropriately with a normal affect.  ECG: Oct 28, 2013:  NSR with sinus arrhythmia.    Assessment / Plan:

## 2013-12-21 ENCOUNTER — Institutional Professional Consult (permissible substitution): Payer: BC Managed Care – PPO | Admitting: Pulmonary Disease

## 2013-12-28 ENCOUNTER — Encounter: Payer: Self-pay | Admitting: Neurology

## 2013-12-28 ENCOUNTER — Ambulatory Visit (INDEPENDENT_AMBULATORY_CARE_PROVIDER_SITE_OTHER): Payer: BC Managed Care – PPO | Admitting: Neurology

## 2013-12-28 VITALS — BP 143/78 | HR 59 | Ht 68.0 in | Wt 261.0 lb

## 2013-12-28 DIAGNOSIS — R569 Unspecified convulsions: Secondary | ICD-10-CM

## 2013-12-28 NOTE — Patient Instructions (Signed)
Overall you are doing fairly well but I do want to suggest a few things today:   As far as your medications are concerned, I would like to suggest you continue on trileptal 300mg  daily  Please have your recent blood work faxed to our office  I would like to see you back in 6 months, sooner if we need to. Please call us with any interim questions, concerns, problems, updates or refill requests.   My clinical assistant and will answer any of your questions and relay your messages to me and also relay most of my messages to you.   Our phone number is [phone]. We also have an after hours call service for urgent matters and there is a physician on-call for urgent questions. For any emergencies you know to call 911 or go to the nearest emergency room

## 2013-12-28 NOTE — Progress Notes (Signed)
[facility]    Provider:  Dr Hosie Lozano Referring Provider: Kathlee NationsEason, Paul, MD Primary Care Physician:  Isaac NationsEASON,PAUL, MD  CC:  Seizure disorder  HPI:  Isaac Lozano is a 58 y.o. male here as a referral from Isaac. Maryellen PileEason for seizure disorder. Returns for follow up with last visit 06/2013. Notes no breakthrough seizures. Continues to have brief intermittent auras described as a strange sensation in his stomach, gets a little sweaty and then it resolves. Continues to take trileptal 300mg  daily. Tolerating well. No dizziness, no gait instability.   Continues to have chronic lumbar back pain. He is receiving injections for this , scheduled to get this next week. Has some ankle pain in his right foot, has been told he has a torn tendon, suggested he needs surgery but he wishes to hold off.    Initial visit 06/2013: Currently followed by Isaac Elenora FenderMadonia for seizures, he is retiring so patient is transitioning care. Had brain surgery in 1991 for seizures, states the intervention involved the left temporal lobe. 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 Has not had a seizure since prior to surgery in 1991. He will occasionally have auras, can happen a few times a month. Aura described as a "clammy" sensation, some paresthesias in perioral region, some anxiety. Episodes last a few minutes and then resolve. He notices the symptoms more when he has a small cold or fever.   He is currently taking Trileptal 300mg  daily once a day. Instructed to take 2x a day if the aura increases. Doing this maybe once a month.   Otherwise healthy overall. Has a lot of joint/bone pain. Gets cortisol injections for lumbar degenerative disc disease. Takes diclofenac as needed for pain.  Takes a once a day vitamin, states it has vitamin D in it.   Review of Systems: Out of a complete 14 system review, the patient complains of only the following symptoms, and all other  reviewed systems are negative. Positive for snoring fatigue  History   Social History  . Marital Status: Single    Spouse Name: N/A    Number of Children: 1  . Years of Education: Associates   Occupational History  .     Social History Main Topics  . Smoking status: Never Smoker   . Smokeless tobacco: Never Used  . Alcohol Use: No  . Drug Use: No  . Sexual Activity: Not on file   Other Topics Concern  . Not on file   Social History Narrative   Patient lives at home alone, has 1 child   Patient is right handed   Education level is Associates degree   Caffeine consumption is 2-3 daily    Family History  Problem Relation Age of Onset  . Cancer Father     Past Medical History  Diagnosis Date  . SOB (shortness of breath)     Chronic   . Elevated CPK   . Muscle pain     Chronic  . Hypothyroidism   . Sleep apnea   . Depression   . HTN (hypertension)   . Seizure disorder   . Scoliosis   . GERD (gastroesophageal reflux disease)   . Hypokalemia   . Low testosterone   . Venous insufficiency   . Lumbar radiculopathy     right sided  . Osteoarthritis     both knees  . Morbid obesity   . Sleep apnea     Past Surgical History  Procedure Laterality Date  . Knee surgery      Left  . Brain surgery    . Ventral hernia repair      Current Outpatient Prescriptions  Medication Sig Dispense Refill  . acetaminophen (TYLENOL) 500 MG tablet Take 500 mg by mouth every 6 (six) hours as needed.      . diclofenac (VOLTAREN) 75 MG EC tablet       . famotidine (PEPCID) 20 MG tablet       . levothyroxine (SYNTHROID, LEVOTHROID) 75 MCG tablet Take 75 mcg by mouth daily.      . Multiple Vitamin (MULTIVITAMIN) tablet Take 1 tablet by mouth daily.      Marland Kitchen omeprazole (PRILOSEC) 40 MG capsule       . Oxcarbazepine (TRILEPTAL) 300 MG tablet Take 1 tablet (300 mg total) by mouth daily.  90 tablet  1   No current facility-administered medications for this visit.    Allergies as  of 12/28/2013  . (No Known Allergies)    Vitals: BP 143/78  Pulse 59  Ht 5\' 8"  (1.727 m)  Wt 261 lb (118.389 kg)  BMI 39.69 kg/m2 Last Weight:  Wt Readings from Last 1 Encounters:  12/28/13 261 lb (118.389 kg)   Last Height:   Ht Readings from Last 1 Encounters:  12/28/13 5\' 8"  (1.727 m)     Physical exam: Exam: Gen: NAD, conversant Eyes: anicteric sclerae, moist conjunctivae HENT: Atraumatic, oropharynx clear Neck: Trachea midline; supple,  Lungs: CTA, no wheezing, rales, rhonic                          CV: RRR, no MRG Abdomen: Soft, non-tender;  Extremities: No peripheral edema  Skin: Normal temperature, no rash,  Psych: Appropriate affect, pleasant  Neuro: MS: AA&Ox3, appropriately interactive, normal affect   Memory: good recent and remote recall  CN: PERRL, EOMI no nystagmus, no ptosis, sensation intact to LT V1-V3 bilat, face symmetric, no weakness, hearing grossly intact, palate elevates symmetrically, shoulder shrug 5/5 bilat,  tongue protrudes midline, no fasiculations noted.  Motor: normal bulk and tone Strength: 5/5  In all extremities  Coord: rapid alternating and point-to-point (FNF, HTS) movements intact.  Reflexes: symmetrical, bilat downgoing toes  Sens: LT intact in all extremities  Gait: posture, stance, stride and arm-swing normal. Tandem gait intact. Able to walk on heels and toes. Romberg absent.   Assessment:  After physical and neurologic examination, review of laboratory studies, imaging, neurophysiology testing and pre-existing records, assessment will be reviewed on the problem list.  Plan:  Treatment plan and additional workup will be reviewed under Problem List.  1)Seizures 2)Lumbar back pain 44)OSA   58 year old gentleman with history of seizure disorder, status post surgery in 1991, presented for follow up evaluation. Last seizure was in 1991. He is currently taking Trileptal 300 mg daily. He occasional have aura symptoms  but no full seizures. Tolerating medication well. He takes a daily vitamin, which he states has vitamin D in it. Physical exam is unremarkable. Will continue patient on current dose of Trileptal. He will fax recent blood work from his PCP. He will follow up with his PCP in regards to his sleep apnea. Follow up in 6 months.   Elspeth Cho, DO  [facility] [address] [place], [place] [address]  Phone [phone] Fax [phone]

## 2014-01-11 ENCOUNTER — Encounter: Payer: Self-pay | Admitting: Pulmonary Disease

## 2014-01-11 ENCOUNTER — Ambulatory Visit (INDEPENDENT_AMBULATORY_CARE_PROVIDER_SITE_OTHER): Payer: BC Managed Care – PPO | Admitting: Pulmonary Disease

## 2014-01-11 VITALS — BP 140/82 | HR 54 | Temp 98.5°F | Ht 67.0 in | Wt 260.6 lb

## 2014-01-11 DIAGNOSIS — R05 Cough: Secondary | ICD-10-CM | POA: Insufficient documentation

## 2014-01-11 DIAGNOSIS — R053 Chronic cough: Secondary | ICD-10-CM | POA: Insufficient documentation

## 2014-01-11 DIAGNOSIS — R059 Cough, unspecified: Secondary | ICD-10-CM

## 2014-01-11 MED ORDER — OMEPRAZOLE 40 MG PO CPDR: 40.0000 mg | DELAYED_RELEASE_CAPSULE | Freq: Two times a day (BID) | ORAL | Status: DC

## 2014-01-11 NOTE — Patient Instructions (Signed)
Take omeprazole 40mg  one in am AND pm for next 3 weeks. Change your pepcid to 40mg  at bedtime. Stop claritin and zyrtec.  Take chlorpheniramine 4mg  OTC, and take 2 each night at bedtime for 3 weeks. Take nasonex 2 sprays each nostril each am for 3 weeks. No throat clearing, and limit your voice use as much as possible. (no yelling, singing, prolonged conversation) Use hard candy, no mint or menthol cough drops, to bathe the back of your throat during the day and help prevent tickle. Please call me in 3 weeks with update on how things are going.  If still having issues, may need to see allergist or GI doctor.

## 2014-01-11 NOTE — Assessment & Plan Note (Signed)
The patient's history is classic for upper airway irritability with cyclical coughing. I suspect this is related to postnasal drip as well as a component of laryngopharyngeal reflux. He also has chronic throat clearing which further propagates his cough. There is nothing to suggest a pulmonary etiology for his cough, with clear lung fields, apparently normal x-ray, and no evidence for airflow obstruction. At this point, I would like to treat him aggressively for postnasal drip, reflux, and also behavioral therapies to minimize irritation to the upper airway. I have reviewed all this with him. If he continues to have issues, he may need further evaluation with an allergy referral and possibly gastroenterology referral.

## 2014-01-11 NOTE — Progress Notes (Signed)
   Subjective:    Patient ID: Isaac Lozano, male    DOB: 11/16/1955, 58 y.o.   MRN: [id number]  HPI The patient is a 58 year old male who I've been asked to see for a chronic cough. The patient states he was in his usual state of health until approximately 10 months ago when he developed an upper respiratory infection. This resolved spontaneously, but his cough continued.  The cough can be dry and hacking at times, and other times can produce thick clear mucus. He notes severe ongoing chronic nasal congestion and postnasal drip.  He complains of a severe tickle in his throat as well as a classic globus sensation, that initiates the cough. He admits to clearing his throat frequently during the day. The patient denies classic reflux symptoms, but does note a burning feeling in his throat. He apparently has had a chest [place] 3 [place] ago in [place], and was told that it was normal. He has also been recently evaluated by otolaryngology, with laryngoscopy showing moderate post- glottic erythema but no structural abnormalities.  The patient denies any history of asthma, and states there's been no change in his exertional tolerance over the last one year.   Review of Systems  Constitutional: Negative for fever and unexpected weight change.  HENT: Positive for congestion, postnasal drip and sinus pressure. Negative for dental problem, ear pain, nosebleeds, rhinorrhea, sneezing, sore throat and trouble swallowing.   Eyes: Negative for redness and itching.  Respiratory: Positive for cough and shortness of breath. Negative for chest tightness and wheezing.   Cardiovascular: Negative for palpitations and leg swelling.  Gastrointestinal: Negative for nausea and vomiting.  Genitourinary: Negative for dysuria.  Musculoskeletal: Negative for joint swelling.  Skin: Negative for rash.  Neurological: Negative for headaches.  Hematological: Does not bruise/bleed easily.  Psychiatric/Behavioral: Positive for  dysphoric mood. The patient is nervous/anxious.        Objective:   Physical Exam Constitutional:  Overweight male, no acute distress  HENT:  Nares patent without discharge, but erythematous mucosa, no purulence  Oropharynx without exudate, palate and uvula are elongated.  Eyes:  Perrla, eomi, no scleral icterus  Neck:  No JVD, no TMG  Cardiovascular:  Normal rate, regular rhythm, no rubs or gallops.  No murmurs        Intact distal pulses  Pulmonary :  Normal breath sounds, no stridor or respiratory distress   No rales, rhonchi, or wheezing  Abdominal:  Soft, nondistended, bowel sounds present.  No tenderness noted.   Musculoskeletal:  Minimal lower extremity edema noted.  Lymph Nodes:  No cervical lymphadenopathy noted  Skin:  No cyanosis noted  Neurologic:  Alert, appropriate, moves all 4 extremities without obvious deficit.         Assessment & Plan:

## 2014-01-19 ENCOUNTER — Other Ambulatory Visit: Payer: Self-pay | Admitting: Neurology

## 2014-02-19 ENCOUNTER — Other Ambulatory Visit: Payer: Self-pay | Admitting: Pulmonary Disease

## 2014-03-14 DIAGNOSIS — K219 Gastro-esophageal reflux disease without esophagitis: Secondary | ICD-10-CM | POA: Insufficient documentation

## 2014-06-09 ENCOUNTER — Telehealth: Payer: Self-pay | Admitting: Neurology

## 2014-06-09 NOTE — Telephone Encounter (Signed)
Patient has a question about medication gabapentin - is it safe to take with what he is currently taking. Best number to call back is [phone]. It is okay to leave a detailed message.

## 2014-06-09 NOTE — Telephone Encounter (Signed)
I called back.  Relayed Dr Zannie CoveYan's message.  He verbalized understanding and will be in for appt this month.

## 2014-06-09 NOTE — Telephone Encounter (Signed)
Isaac Lozano: I have reviewed his medication list, it is okay to take gabapentin along with some medication that his taking now, he should keep his follow-up appointment January 20 eighth 2016

## 2014-06-30 ENCOUNTER — Ambulatory Visit: Payer: BC Managed Care – PPO | Admitting: Neurology

## 2014-07-21 ENCOUNTER — Other Ambulatory Visit: Payer: Self-pay | Admitting: Neurology

## 2014-07-21 NOTE — Telephone Encounter (Signed)
Former Magazine features editorumner patient assigned to Dr Terrace ArabiaYan, has appt scheduled

## 2014-08-01 ENCOUNTER — Encounter: Payer: Self-pay | Admitting: Neurology

## 2014-08-01 ENCOUNTER — Ambulatory Visit (INDEPENDENT_AMBULATORY_CARE_PROVIDER_SITE_OTHER): Payer: BLUE CROSS/BLUE SHIELD | Admitting: Neurology

## 2014-08-01 VITALS — BP 145/77 | HR 58 | Ht 67.0 in | Wt 254.0 lb

## 2014-08-01 DIAGNOSIS — G4733 Obstructive sleep apnea (adult) (pediatric): Secondary | ICD-10-CM | POA: Insufficient documentation

## 2014-08-01 DIAGNOSIS — R569 Unspecified convulsions: Secondary | ICD-10-CM

## 2014-08-01 DIAGNOSIS — G40209 Localization-related (focal) (partial) symptomatic epilepsy and epileptic syndromes with complex partial seizures, not intractable, without status epilepticus: Secondary | ICD-10-CM | POA: Insufficient documentation

## 2014-08-01 NOTE — Progress Notes (Signed)
[facility]    Provider:  Dr Hosie Poisson Referring Provider: Kathlee Nations, MD Primary Care Physician:  Kathlee Nations, MD  CC:  Seizure disorder  HPI:  Isaac Lozano is a 59 y.o. male here as a referral from Dr. [name] for seizure disorder. Initial visit with Dr. Hosie Poisson in Jan 2015.  He previously was followed by Dr Elenora Fender for seizures, he is retiring so patient is transitioning care. He had left temporal lobectomy in 1991 at [place] [facility] [place], for seizures.  He has to quit driving for 3 years prior to have surgery due to his frequent seizure activities. 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 Has not had a generalized seizure since since his surgery in 1991. He will occasionally have auras, can happen a few times a month. Aura described as a "clammy" sensation, some paresthesias in perioral region, some anxiety. Episodes last a few minutes and then resolve. 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  He also complains of multiple joints pain, low back pain, getting epidural injection occasionally, which has been helpful,  He reported history of obstructive sleep apnea, could not tolerate CPAP machine the past, he still has lots snoring, wake up occasionally during the nighttime, today ESS is 9, FSS is 27   Review of Systems: Out of a complete 14 system review, the patient complains of only the following symptoms, and all other reviewed systems are negative. Positive for snoring fatigue, seizure, joint pain, agitation, depression, shortness of breath  History   Social History  . Marital Status: Single    Spouse Name: N/A  . Number of Children: 1  . Years of Education: Associates    Occupational History  . rest manager    Social History Main Topics  . Smoking status: Never Smoker   . Smokeless tobacco: Never Used  . Alcohol Use: No  . Drug Use: No  . Sexual Activity: Not on file   Other Topics Concern  . Not on file   Social History Narrative   Patient lives at home alone, has 1 child   Patient is right handed   Education level is Associates degree   Caffeine consumption is 2-3 daily    Family History  Problem Relation Age of Onset  . Cancer Father     Past Medical History  Diagnosis Date  . SOB (shortness of breath)     Chronic   . Elevated CPK   . Muscle pain     Chronic  . Hypothyroidism   . Sleep apnea   . Depression   . HTN (hypertension)   . Seizure disorder   . Scoliosis   . GERD (gastroesophageal reflux disease)   . Hypokalemia   . Low testosterone   . Venous insufficiency   . Lumbar radiculopathy     right sided  . Osteoarthritis     both knees  . Morbid obesity   . Sleep apnea     Past Surgical History  Procedure Laterality Date  . Knee surgery      Left  . Brain surgery    . Ventral hernia repair      Current Outpatient Prescriptions  Medication Sig Dispense Refill  . acetaminophen (TYLENOL) 500 MG  tablet Take 500 mg by mouth every 6 (six) hours as needed.    . diclofenac (VOLTAREN) 75 MG EC tablet Take 75 mg by mouth 2 (two) times daily.     . famotidine (PEPCID) 20 MG tablet Take 20 mg by mouth 2 (two) times daily.     Marland Kitchen. levothyroxine (SYNTHROID, LEVOTHROID) 88 MCG tablet Take 88 mcg by mouth daily before breakfast.    . loratadine (CLARITIN) 10 MG tablet Take 10 mg by mouth daily.    . Multiple Vitamin (MULTIVITAMIN) tablet Take 1 tablet by mouth daily.    . Oxcarbazepine (TRILEPTAL) 300 MG tablet TAKE 1 TABLET DAILY 90 tablet 0   No current facility-administered medications for this visit.   Vitals: BP 145/77 mmHg  Pulse 58  Ht 5\' 7"  (1.702 m)  Wt 254 lb (115.214 kg)  BMI 39.77 kg/m2 Last Weight:  Wt  Readings from Last 1 Encounters:  08/01/14 254 lb (115.214 kg)   Last Height:   Ht Readings from Last 1 Encounters:  08/01/14 5\' 7"  (1.702 m)   PHYSICAL EXAMNIATION:  Gen: NAD, conversant, well nourised, obese, well groomed                     Cardiovascular: Regular rate rhythm, no peripheral edema, warm, nontender. Eyes: Conjunctivae clear without exudates or hemorrhage Neck: Supple, no carotid bruise. Pulmonary: Clear to auscultation bilaterally   NEUROLOGICAL EXAM:  MENTAL STATUS: Speech:    Speech is normal; fluent and spontaneous with normal comprehension.  Cognition:    The patient is oriented to person, place, and time;     recent and remote memory intact;     language fluent;     normal attention, concentration,     fund of knowledge.  CRANIAL NERVES: CN II: Visual fields are full to confrontation. Fundoscopic exam is normal with sharp discs and no vascular changes. Venous pulsations are present bilaterally. Pupils are 4 mm and briskly reactive to light. Visual acuity is 20/20 bilaterally. CN III, IV, VI: extraocular movement are normal. No ptosis. CN V: Facial sensation is intact to pinprick in all 3 divisions bilaterally. Corneal responses are intact.  CN VII: Face is symmetric with normal eye closure and smile. CN VIII: Hearing is normal to rubbing fingers CN IX, X: Palate elevates symmetrically. Phonation is normal. CN XI: Head turning and shoulder shrug are intact CN XII: Tongue is midline with normal movements and no atrophy.  MOTOR: There is no pronator drift of out-stretched arms. Muscle bulk and tone are normal. Muscle strength is normal.   Shoulder abduction Shoulder external rotation Elbow flexion Elbow extension Wrist flexion Wrist extension Finger abduction Hip flexion Knee flexion Knee extension Ankle dorsi flexion Ankle plantar flexion  R 5 5 5 5 5 5 5 5 5 5 5 5   L 5 5 5 5 5 5 5 5 5 5 5 5     REFLEXES: Reflexes are 2+ and symmetric at the biceps,  triceps, knees, and ankles. Plantar responses are flexor.  SENSORY: Light touch, pinprick, position sense, and vibration sense are intact in fingers and toes.  COORDINATION: Rapid alternating movements and fine finger movements are intact. There is no dysmetria on finger-to-nose and heel-knee-shin. There are no abnormal or extraneous movements.   GAIT/STANCE:  mild antalgic gait,  Romberg is absent.   Assessment/Plan: 59 years old male, with history of complex partial seizure, status post left temporal lobectomy in 1991, he is Trileptal 300 mg daily, continue has  recurrent episodes of transient flushing sensation suggestive of simple partial seizure,   also with history of low back pain, obstructive sleep apnea  1, EEG, 2, keep current dose of Trileptal 300 mg daily, get medical records from primary care physician, 3. He wants to hold off sleep study for his obstructive sleep apnea right now, has dental work pending, 4. Low back pain, continue follow-up with primary care, pain management for epidural injection, when necessary NSAIDs. 5, return to clinic in one month    Levert Feinstein, M.D. Ph.D.  [facility] [address] [place], [place] [address] Phone: [phone] Fax:      [phone]

## 2014-08-10 ENCOUNTER — Ambulatory Visit (INDEPENDENT_AMBULATORY_CARE_PROVIDER_SITE_OTHER): Payer: BLUE CROSS/BLUE SHIELD | Admitting: Neurology

## 2014-08-10 DIAGNOSIS — R569 Unspecified convulsions: Secondary | ICD-10-CM | POA: Diagnosis not present

## 2014-08-10 DIAGNOSIS — G4733 Obstructive sleep apnea (adult) (pediatric): Secondary | ICD-10-CM

## 2014-08-12 ENCOUNTER — Telehealth: Payer: Self-pay | Admitting: Neurology

## 2014-08-12 NOTE — Procedures (Signed)
   HISTORY:  59 years old male, with history of complex partial seizure, status post left temporal lobectomy 1991, presents with recurrent episodes of transient flushing sensation, suggestive of simple partial seizure.  TECHNIQUE:  16 channel EEG was performed based on standard 10-16 international system. One channel was dedicated to EKG, which has demonstrates normal sinus rhythm of  66 beats per minutes.  Upon awakening, the posterior background activity was well-developed, in alpha range, with amplitude of microvoltage, reactive to eye opening and closure.  There was asymmetry at activities, left T5, F 7 leads have intermittent disarrhythmic delta activity,  There was also intermittent T5,  F7, O1 sharp transient,  Increased appearance following photic stimulation, hyperventilaion  Photic stimulation was performed, which induced a symmetric photic driving.  Hyperventilation was performed,  There was increased appearance of transient sharp transient involving T5, F7, O1. No sleep was achieved.  CONCLUSION: This is a mild abnormal EEG.  There is electrodiagnostic evidence of  mild slowing involving left frontal temporal region, with intermittent sharp transient, indicating focal irritability.

## 2014-08-12 NOTE — Telephone Encounter (Signed)
I called him EEG result. He should be compliance with his seizures. Increase trileptal to 300mg  1/2 po qam and one at night   This is a mild abnormal EEG. There is electrodiagnostic evidence  of mild slowing involving left frontal temporal region, with  intermittent sharp transient, indicating focal irritability.

## 2014-08-12 NOTE — Telephone Encounter (Signed)
Patient calling for EEG results.  Please call and advise. °

## 2014-08-24 ENCOUNTER — Ambulatory Visit (INDEPENDENT_AMBULATORY_CARE_PROVIDER_SITE_OTHER): Payer: BLUE CROSS/BLUE SHIELD | Admitting: Neurology

## 2014-08-24 ENCOUNTER — Encounter: Payer: Self-pay | Admitting: Neurology

## 2014-08-24 ENCOUNTER — Telehealth: Payer: Self-pay | Admitting: Neurology

## 2014-08-24 VITALS — BP 142/79 | HR 56 | Ht 67.0 in | Wt 255.0 lb

## 2014-08-24 DIAGNOSIS — G4733 Obstructive sleep apnea (adult) (pediatric): Secondary | ICD-10-CM | POA: Diagnosis not present

## 2014-08-24 DIAGNOSIS — R569 Unspecified convulsions: Secondary | ICD-10-CM | POA: Diagnosis not present

## 2014-08-24 NOTE — Progress Notes (Signed)
[facility]    Provider:  Dr Hosie Poisson Referring Provider: Kathlee Nations, MD Primary Care Physician:  Kathlee Nations, MD  CC:  Seizure disorder  HPI:  Isaac Lozano is a 59 y.o. male here as a referral from Dr. [name] for seizure disorder. Initial visit with Dr. Hosie Poisson in Jan 2015.  He previously was followed by Dr Elenora Fender for seizures, he is retiring, so patient is transitioning care. He had left temporal lobectomy in 1991 at [place] [facility] [place], for seizures.  He has to quit driving for 3 years prior to have surgery due to his frequent seizure activities. 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 Has not had a generalized seizure since since his surgery in 1991. He will occasionally have auras, can happen a few times a month. Aura described as a "clammy" sensation, some paresthesias in perioral region, some anxiety. Episodes last a few minutes and then resolve. 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  He also complains of multiple joints pain, low back pain, getting epidural injection occasionally, which has been helpful,  He reported history of obstructive sleep apnea, could not tolerate CPAP machine the past, he still has lots snoring, wake up occasionally during the nighttime, today ESS is 9, FSS is 74  UPDATE August 24 2014: Repeat EEG August 12 2014, was abnormal, there was mild slowing involving left frontal temporal region, with intermittent sharp transient, indicating focal irritability  Laboratory evaluation March 23 2014, normal CBC, normal CMP, A1c 6.1  He still has intermittent episodes of aura, numbness of frontal teeth, feel the travelling warmness in his face, blood warm in his  arms, lasting less than 2 minutes, anxious feeling, once every 2 months   While taking Trileptal was increased to  1/2 in noon and one tab po qhs, mild goggy.  He has signs of obstructive sleep apnea, he is going through dental work,   Review of Systems: Out of a complete 14 system review, the patient complains of only the following symptoms, and all other reviewed systems are negative. Positive for snoring fatigue, seizure, joint pain, agitation, depression, shortness of breath  History   Social History  . Marital Status: Single    Spouse Name: N/A  . Number of Children: 1  . Years of Education: Associates   Occupational History  . rest manager    Social History Main Topics  . Smoking status: Never Smoker   . Smokeless tobacco: Never Used  . Alcohol Use: No  . Drug Use: No  . Sexual Activity: Not on file   Other Topics Concern  . Not on file   Social History Narrative   Patient lives at home alone, has 1 child   Patient is right handed   Education level is Associates degree   Caffeine consumption is 2-3 daily    Family History  Problem Relation Age of Onset  . Cancer Father     Past Medical History  Diagnosis Date  . SOB (shortness of breath)     Chronic   . Elevated CPK   . Muscle pain     Chronic  . Hypothyroidism   . Sleep apnea   . Depression   . HTN (hypertension)   .  Seizure disorder   . Scoliosis   . GERD (gastroesophageal reflux disease)   . Hypokalemia   . Low testosterone   . Venous insufficiency   . Lumbar radiculopathy     right sided  . Osteoarthritis     both knees  . Morbid obesity   . Sleep apnea     Past Surgical History  Procedure Laterality Date  . Knee surgery      Left  . Brain surgery    . Ventral hernia repair      Current Outpatient Prescriptions  Medication Sig Dispense Refill  . acetaminophen (TYLENOL) 500 MG tablet Take 500 mg by mouth every 6 (six) hours as needed.    . diclofenac (VOLTAREN) 75 MG EC tablet  Take 75 mg by mouth 2 (two) times daily.     . famotidine (PEPCID) 20 MG tablet Take 20 mg by mouth 2 (two) times daily.     Marland Kitchen levothyroxine (SYNTHROID, LEVOTHROID) 88 MCG tablet Take 88 mcg by mouth daily before breakfast.    . loratadine (CLARITIN) 10 MG tablet Take 10 mg by mouth daily.    . Multiple Vitamin (MULTIVITAMIN) tablet Take 1 tablet by mouth daily.    . Oxcarbazepine (TRILEPTAL) 300 MG tablet TAKE 1 TABLET DAILY 90 tablet 0   No current facility-administered medications for this visit.   Vitals: BP 142/79 mmHg  Pulse 56  Ht  (1.702 m)  Wt 255 lb (115.667 kg)  BMI 39.93 kg/m2 Last Weight:  Wt Readings from Last 1 Encounters:  08/24/14 255 lb (115.667 kg)   Last Height:   Ht Readings from Last 1 Encounters:  08/24/14  (1.702 m)   PHYSICAL EXAMNIATION:  Gen: NAD, conversant, well nourised, obese, well groomed                     Cardiovascular: Regular rate rhythm, no peripheral edema, warm, nontender. Eyes: Conjunctivae clear without exudates or hemorrhage Neck: Supple, no carotid bruise. Pulmonary: Clear to auscultation bilaterally   NEUROLOGICAL EXAM:  MENTAL STATUS: Speech:    Speech is normal; fluent and spontaneous with normal comprehension.  Cognition:    The patient is oriented to person, place, and time;     recent and remote memory intact;     language fluent;     normal attention, concentration,     fund of knowledge.  CRANIAL NERVES: CN II: Visual fields are full to confrontation. Fundoscopic exam is normal with sharp discs and no vascular changes. Venous pulsations are present bilaterally. Pupils are 4 mm and briskly reactive to light. Visual acuity is 20/20 bilaterally. CN III, IV, VI: extraocular movement are normal. No ptosis. CN V: Facial sensation is intact to pinprick in all 3 divisions bilaterally. Corneal responses are intact.  CN VII: Face is symmetric with normal eye closure and smile. CN VIII: Hearing is normal to rubbing  fingers CN IX, X: Palate elevates symmetrically. Phonation is normal. CN XI: Head turning and shoulder shrug are intact CN XII: Tongue is midline with normal movements and no atrophy.  MOTOR: There is no pronator drift of out-stretched arms. Muscle bulk and tone are normal. Muscle strength is normal.   Shoulder abduction Shoulder external rotation Elbow flexion Elbow extension Wrist flexion Wrist extension Finger abduction Hip flexion Knee flexion Knee extension Ankle dorsi flexion Ankle plantar flexion  R L 5  5 5 5 5 5 5     REFLEXES: Reflexes are 2+ and symmetric at the biceps, triceps, knees, and ankles. Plantar responses are flexor.  SENSORY: Light touch, pinprick, position sense, and vibration sense are intact in fingers and toes.  COORDINATION: Rapid alternating movements and fine finger movements are intact. There is no dysmetria on finger-to-nose and heel-knee-shin. There are no abnormal or extraneous movements.   GAIT/STANCE:  mild antalgic gait,  Romberg is absent.   Assessment/Plan: 59 years old male, with history of complex partial seizure, status post left temporal lobectomy in 1991, he is Trileptal 300 mg daily, continue has recurrent episodes of transient flushing sensation suggestive of simple partial seizure,   also with history of low back pain, obstructive sleep apnea  1,keep current dose of Trileptal 300 mg 1/2+one tab daily,  2. He wants to hold off sleep study for his obstructive sleep apnea right now, has dental work pending, 3. Low back pain, continue follow-up with primary care, pain management for epidural injection, when necessary NSAIDs. 4. RTC in 6 months with Eber Jonesarolyn 5. Medical record.   Levert FeinsteinYijun Kenith Trickel, M.D. Ph.D.  [facility] [address] [place], [place] [address] Phone: [phone] Fax:      [phone]

## 2014-08-24 NOTE — Telephone Encounter (Signed)
Please get record from Dr. Maryellen PileEason,  His MRI brain report. EEG report,    Ph, [phone]  Dr. Elenora FenderMadonia, [phone], his previous craniotomy record.

## 2014-10-05 ENCOUNTER — Telehealth: Payer: Self-pay | Admitting: Neurology

## 2014-10-05 MED ORDER — OXCARBAZEPINE 300 MG PO TABS
450.0000 mg | ORAL_TABLET | Freq: Every day | ORAL | Status: DC
Start: 2014-10-05 — End: 2014-10-10

## 2014-10-05 NOTE — Telephone Encounter (Signed)
Last OV note says: keep current dose of Trileptal 300 mg 1/2+one tab daily I called the patient back.  He would like the Rx sent to [facility].  Rx has been sent.  Receipt confirmed by pharmacy.  Patient is aware.

## 2014-10-05 NOTE — Telephone Encounter (Signed)
Patient called and stated that the pharmacy told him they were having issues getting in contact with us in regards to getting a new Rx. Oxcarbazepine (TRILEPTAL) 300 MG tablet changed. Please call and advise.

## 2014-10-10 ENCOUNTER — Other Ambulatory Visit: Payer: Self-pay | Admitting: Neurology

## 2014-10-10 ENCOUNTER — Telehealth: Payer: Self-pay | Admitting: Neurology

## 2014-10-10 MED ORDER — OXCARBAZEPINE 300 MG PO TABS: 450.0000 mg | ORAL_TABLET | Freq: Every day | ORAL | Status: DC

## 2014-10-10 NOTE — Telephone Encounter (Signed)
Patient has been waiting on Rx. Oxcarbazepine (TRILEPTAL) 300 MG tablet through [facility] and has not received the medication yet, he is out of the medicine after today and wanted to know if he could get enough to last him until the refill comes in. Please call and advise.

## 2014-10-10 NOTE — Telephone Encounter (Signed)
Duplicate Request.

## 2014-10-10 NOTE — Telephone Encounter (Signed)
Patient called back about Oxcarbazepine 300 mg tablet.  Thanks!

## 2014-10-10 NOTE — Telephone Encounter (Signed)
I called the patient back.  He would like a 14 day supply of meds sent to [facility].  Rx has been sent.  Patient is aware.

## 2014-12-19 ENCOUNTER — Ambulatory Visit (INDEPENDENT_AMBULATORY_CARE_PROVIDER_SITE_OTHER): Payer: BLUE CROSS/BLUE SHIELD

## 2014-12-19 ENCOUNTER — Encounter: Payer: Self-pay | Admitting: Podiatry

## 2014-12-19 ENCOUNTER — Ambulatory Visit (INDEPENDENT_AMBULATORY_CARE_PROVIDER_SITE_OTHER): Payer: BLUE CROSS/BLUE SHIELD | Admitting: Podiatry

## 2014-12-19 VITALS — BP 149/74 | HR 58 | Resp 15

## 2014-12-19 DIAGNOSIS — M1990 Unspecified osteoarthritis, unspecified site: Secondary | ICD-10-CM | POA: Diagnosis not present

## 2014-12-19 DIAGNOSIS — M2041 Other hammer toe(s) (acquired), right foot: Secondary | ICD-10-CM | POA: Diagnosis not present

## 2014-12-19 DIAGNOSIS — M6789 Other specified disorders of synovium and tendon, multiple sites: Secondary | ICD-10-CM

## 2014-12-19 DIAGNOSIS — M79671 Pain in right foot: Secondary | ICD-10-CM

## 2014-12-19 DIAGNOSIS — Q828 Other specified congenital malformations of skin: Secondary | ICD-10-CM | POA: Diagnosis not present

## 2014-12-19 DIAGNOSIS — M76829 Posterior tibial tendinitis, unspecified leg: Secondary | ICD-10-CM

## 2014-12-19 DIAGNOSIS — Q6650 Congenital pes planus, unspecified foot: Secondary | ICD-10-CM | POA: Diagnosis not present

## 2014-12-19 DIAGNOSIS — M2031 Hallux varus (acquired), right foot: Secondary | ICD-10-CM

## 2014-12-19 NOTE — Progress Notes (Signed)
Subjective:    Patient ID: Isaac Lozano, male    DOB: 12/21/1955, 59 y.o.   MRN: [id number]  HPI 59 year old male presents the office today for surgical consultation from Dr. Elijah Birkom for concerns of his right ankle turning inwards as well as for painful callus on the side of the big toe joint. He states he has had this issue for greater than 1 year. He states he has pain to the "ankle" particularly with ambulation and prolonged weightbearing. He is able to do regular activities without much discomfort however after prolonged ambulation and he does have pain. He also states that he gets a painful calluses side of the big toe on the right foot as well. He periodically is the area trimmed however does recur. Denies any redness or drainage around this area. He previously has tried what appears to be an [place] brace although he states initially wear it for an hour or so in a time before becomes uncomfortable. No other complaints at this time.   Review of Systems  All other systems reviewed and are negative.      Objective:   Physical Exam AAO x3, NAD DP/PT pulses palpable bilaterally, CRT less than 3 seconds Protective sensation intact with Simms Weinstein monofilament, vibratory sensation intact, Achilles tendon reflex intact Nonweightbearing exam reveals that the ankle joint range of motion is intact the right side. The subtalar joint on the right side is restricted and there is crepitation with range of motion. Metatarsal MTPJ range of motion is intact. Hallux IPJ range of motion is limited in the hallux sits in a contracted position at the level of the IPJ. There is mild to palpation upon lateral aspect of the foot along the sinus tarsi. There is no pain with ankle joint range of motion. There is no discomfort along the course of posterior tibial tendon the flexor tendons at this time. Equinus is present. Upon weightbearing is a decrease in medial arch height with a right greater the left. He is able  to perform a double heel rise however he is unable to perform a single heel rise on the right. Hyperkeratotic lesion on medial aspects of the right hallux and lower IPJ. Upon removal does not 1 ulceration, drainage or other signs of infection. No other areas of tenderness to bilateral lower extremities. MMT 5/5, ROM WNL.  No open lesions or pre-ulcerative lesions.  No overlying edema, erythema, increase in warmth to bilateral lower extremities.  No pain with calf compression, swelling, warmth, erythema bilaterally.      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  -Treatment options discussed including all alternatives, risks, and complications -I discussed both conservative and surgical treatment options the patient. In regards to flatfoot he would likely require triple arthrodesis however if he decides to pursue surgery we will obtain a MRI prior to this. He wants to hold off on surgery given that he still works and cannot take much time off of work for surgery. Also he states that he has good and bad days and his pain is not as consistent. I do believe that he would benefit from a [place] brace although he does not wear his. He may benefit more from a hinged [place]. Because of this I will have [facility].  -In regards thick hyperkeratotic lesion he was shopping debrided without compensation/bleeding. This is likely due to his foot structure his overpronation.  Also he does have arthritic changes of the IPJ. Discussed with him hallux IPJ arthroplasty. We can do this slowly help reduce some of the callous however I believe that the callus will likely recur given his foot structure. Given to an appropriate brace this may help as well. -Follow-up with [facility] for likely hinged AFO as he cannot tolerate the brace he has now. In the meantime, call with  questions/concerns.  Ovid Curd, DPM

## 2014-12-20 ENCOUNTER — Encounter: Payer: Self-pay | Admitting: Podiatry

## 2015-01-11 ENCOUNTER — Ambulatory Visit: Payer: BLUE CROSS/BLUE SHIELD | Admitting: *Deleted

## 2015-01-11 DIAGNOSIS — M76829 Posterior tibial tendinitis, unspecified leg: Secondary | ICD-10-CM

## 2015-01-11 NOTE — Progress Notes (Signed)
Patient ID: Isaac Lozano, male   DOB: Oct 22, 1955, 59 y.o.   MRN: [id number] Patient presents for brace casting with [facility]

## 2015-01-17 ENCOUNTER — Telehealth: Payer: Self-pay | Admitting: Neurology

## 2015-01-17 NOTE — Telephone Encounter (Signed)
Patient aware of response below and verbalized understanding.

## 2015-01-17 NOTE — Telephone Encounter (Signed)
Dr Odette Fraction gave him a Rx for Gabapentin 300 mg, takes 1, twice daily. He prescribed for his back problems. Is it ok to take with Oxcarbazepine (TRILEPTAL) 300 MG tablet?

## 2015-01-17 NOTE — Telephone Encounter (Signed)
Yes, it is okay for him to take Trileptal with gabapentin, he might experience more side effect such as dizziness, lightheadedness.

## 2015-02-08 ENCOUNTER — Ambulatory Visit: Payer: BLUE CROSS/BLUE SHIELD | Admitting: *Deleted

## 2015-02-08 DIAGNOSIS — M76829 Posterior tibial tendinitis, unspecified leg: Secondary | ICD-10-CM

## 2015-02-08 DIAGNOSIS — M25571 Pain in right ankle and joints of right foot: Secondary | ICD-10-CM

## 2015-02-08 DIAGNOSIS — M24271 Disorder of ligament, right ankle: Secondary | ICD-10-CM

## 2015-02-08 DIAGNOSIS — M2141 Flat foot [pes planus] (acquired), right foot: Secondary | ICD-10-CM

## 2015-02-08 NOTE — Progress Notes (Signed)
Patient ID: Isaac Lozano, male   DOB: April 19, 1956, 59 y.o.   MRN: [id number] Patient presents for Brace fitting [facility].  Written and verbal break in and wear instructions given.

## 2015-02-22 ENCOUNTER — Ambulatory Visit: Payer: BLUE CROSS/BLUE SHIELD | Admitting: Nurse Practitioner

## 2015-03-22 ENCOUNTER — Encounter: Payer: Self-pay | Admitting: Nurse Practitioner

## 2015-03-22 ENCOUNTER — Ambulatory Visit (INDEPENDENT_AMBULATORY_CARE_PROVIDER_SITE_OTHER): Payer: BLUE CROSS/BLUE SHIELD | Admitting: Nurse Practitioner

## 2015-03-22 VITALS — BP 141/76 | HR 52 | Ht 68.0 in | Wt 261.2 lb

## 2015-03-22 DIAGNOSIS — R569 Unspecified convulsions: Secondary | ICD-10-CM | POA: Diagnosis not present

## 2015-03-22 NOTE — Patient Instructions (Signed)
Continue Trileptal 1.5 tabs daily call for refills Please fax recent labs to [phone] Call for any seizure activity Hold off on sleep study for now encouraged to lose weight Continue pain management for chronic back pain Follow-up in 6 months

## 2015-03-22 NOTE — Progress Notes (Signed)
[facility]  PATIENT: Isaac Lozano DOB: 1956/04/16   REASON FOR VISIT: Follow-up for seizure disorder, obstructive sleep apnea HISTORY FROM: Patient    HISTORY OF PRESENT ILLNESS: HISTORY Isaac Lozano is a 59 y.o. male here as a referral from Dr. [name] for seizure disorder. Initial visit with Dr. Hosie Poisson in Jan 2015. He previously was followed by Dr Elenora Fender for seizures, he is retiring, so patient is transitioning care. He had left temporal lobectomy in 1991 at [facility] [place], for seizures. He has to quit driving for 3 years prior to have surgery due to his frequent seizure activities. 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 Has not had a generalized seizure since since his surgery in 1991. He will occasionally have auras, can happen a few times a month. Aura described as a "clammy" sensation, some paresthesias in perioral region, some anxiety. Episodes last a few minutes and then resolve. 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 He also complains of multiple joints pain, low back pain, getting epidural injection occasionally, which has been helpful, He reported history of obstructive sleep apnea, could not tolerate CPAP machine the past, he still has lots snoring, wake up occasionally during the nighttime, today ESS is 9, FSS is 57  UPDATE August 24 2014:YY Repeat EEG August 12 2014, was abnormal, there was mild slowing involving left frontal temporal region, with intermittent sharp transient, indicating focal irritability Laboratory evaluation March 23 2014, normal CBC, normal CMP, A1c 6.1 He still has intermittent episodes of aura, numbness of frontal teeth, feel the travelling  warmness in his face, blood warm in his arms, lasting less than 2 minutes, anxious feeling, once every 2 months While taking Trileptal was increased to  1/2 in noon and one tab po qhs, mild goggy. He has signs of obstructive sleep apnea, he is going through dental work,   UPDATE 03/22/2015 Isaac Lozano, 59 year old male returns for follow-up. He was last seen in the office by Dr.Yan  08/24/2014 . He has history of seizure disorder and is currently on Trileptal 450 daily. He occasionally has an aura but no seizure activity. He may get flushing of his face. He just had labs done at primary care yesterday, I do not have access to those. He also has a history of obstructive sleep apnea unable to use the machine in the past. He realizes that if he loses weight that will make a significant difference. He does snore. He also has a history of chronic back pain managed by pain management. 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 He returns for reevaluation  REVIEW OF SYSTEMS: Full 14 system review of systems performed and notable only for those listed, all others are neg:  Constitutional: neg  Cardiovascular: neg Ear/Nose/Throat: Ringing in the ears Skin: neg Eyes: neg Respiratory: Shortness of breath Gastroitestinal: neg  Hematology/Lymphatic: neg  Endocrine: neg Musculoskeletal: Joint pain joint stiffness back pain aching muscles Allergy/Immunology: neg Neurological: Seizure disorder Psychiatric: Depression Sleep : Snoring   ALLERGIES: No Known Allergies  HOME MEDICATIONS: Outpatient Prescriptions Prior to Visit  Medication Sig Dispense Refill  . acetaminophen (TYLENOL) 500 MG tablet Take 500 mg by mouth every 6 (six) hours as needed.    Marland Kitchen  Alum Hydroxide-Mag Carbonate (GAVISCON PO) Take 1 tablet by mouth daily.    . diclofenac (VOLTAREN) 75 MG EC tablet Take 75 mg by mouth 2 (two) times daily.     . famotidine (PEPCID) 20 MG tablet Take 20 mg by mouth 2 (two) times  daily.     Marland Kitchen levothyroxine (SYNTHROID, LEVOTHROID) 88 MCG tablet Take 88 mcg by mouth daily before breakfast.    . loratadine (CLARITIN) 10 MG tablet Take 10 mg by mouth daily.    . Multiple Vitamin (MULTIVITAMIN) tablet Take 1 tablet by mouth daily.    . Oxcarbazepine (TRILEPTAL) 300 MG tablet Take 1.5 tablets (450 mg total) by mouth daily. 21 tablet 1   No facility-administered medications prior to visit.    PAST MEDICAL HISTORY: Past Medical History  Diagnosis Date  . SOB (shortness of breath)     Chronic   . Elevated CPK   . Muscle pain     Chronic  . Hypothyroidism   . Sleep apnea   . Depression   . HTN (hypertension)   . Seizure disorder (HCC)   . Scoliosis   . GERD (gastroesophageal reflux disease)   . Hypokalemia   . Low testosterone   . Venous insufficiency   . Lumbar radiculopathy     right sided  . Osteoarthritis     both knees  . Morbid obesity (HCC)   . Sleep apnea     PAST SURGICAL HISTORY: Past Surgical History  Procedure Laterality Date  . Knee surgery      Left  . Brain surgery    . Ventral hernia repair      FAMILY HISTORY: Family History  Problem Relation Age of Onset  . Cancer Father     SOCIAL HISTORY: Social History   Social History  . Marital Status: Single    Spouse Name: N/A  . Number of Children: 1  . Years of Education: Associates   Occupational History  . rest manager    Social History Main Topics  . Smoking status: Never Smoker   . Smokeless tobacco: Never Used  . Alcohol Use: No  . Drug Use: No  . Sexual Activity: Not on file   Other Topics Concern  . Not on file   Social History Narrative   Patient lives at home alone, has 1 child   Patient is right handed   Education level is Associates degree   Caffeine consumption is 2-3 daily     PHYSICAL EXAM  Filed Vitals:   03/22/15 1048  BP: 141/76  Pulse: 52  Height:  (1.727 m)  Weight: 261 lb 3.2 oz (118.48 kg)   Body mass index is 39.72  kg/(m^2).  Generalized: Well developed, obese male in no acute distress  Head: normocephalic and atraumatic,. Oropharynx benign  Neck: Supple, no carotid bruits  Cardiac: Regular rate rhythm, no murmur  Musculoskeletal: No deformity   Neurological examination   Mentation: Alert oriented to time, place, history taking. Attention span and concentration appropriate. Recent and remote memory intact.  Follows all commands speech and language fluent.   Cranial nerve II-XII: Pupils were equal round reactive to light extraocular movements were full, visual field were full on confrontational test. Facial sensation and strength were normal. hearing was intact to finger rubbing bilaterally. Uvula tongue midline. head turning and shoulder shrug were normal and symmetric.Tongue protrusion into cheek strength was normal. Motor: normal bulk and tone, full strength in the BUE, BLE, fine finger movements normal, no  pronator drift. No focal weakness Sensory: normal and symmetric to light touch, pinprick, and  Vibration, proprioception  Coordination: finger-nose-finger, heel-to-shin bilaterally, no dysmetria Reflexes: Brachioradialis 2/2, biceps 2/2, triceps 2/2, patellar 2/2, Achilles 2/2, plantar responses were flexor bilaterally. Gait and Station: Rising up from seated position without assistance, normal stance,  moderate stride, good arm swing, smooth turning, able to perform tiptoe, and heel walking without difficulty. Tandem gait is steady  DIAGNOSTIC DATA (LABS, IMAGING, TESTING)   ASSESSMENT AND PLAN  59 y.o. year old male  has a past medical history of SOB (shortness of breath); Elevated CPK; Muscle pain; Hypothyroidism; Sleep apnea; Depression; HTN (hypertension); Seizure disorder (HCC);  Hypokalemia;  Venous insufficiency; Lumbar radiculopathy; Osteoarthritis; Morbid obesity (HCC); and Sleep apnea. here to follow-up.  Continue Trileptal 1.5 tabs daily call for refills Please fax recent labs to  [phone] important to follow sodium level was Trileptal Call for any seizure activity I explained in particular the risks and ramifications of untreated moderate to severe OSA, especially with respect to cardiovascular disease  including congestive heart failure, difficult to treat hypertension, cardiac arrhythmias, or stroke. Even type 2 diabetes has, in part, been linked to untreated OSA. Symptoms of untreated OSA include daytime sleepiness, memory problems, mood irritability and mood disorder such as depression and anxiety, lack of energy, as well as recurrent headaches, especially morning headaches. We talked about trying to maintain a healthy lifestyle in general, as well as the importance of weight control. I encouraged the patient to eat healthy, exercise daily and keep well hydrated, to keep a scheduled bedtime and wake time routine, to not skip any meals and eat healthy snacks in between meals Hold off on sleep study for now encouraged to lose weight per patient's request Continue pain management for chronic back pain Follow-up in 6 monthsVst time 20 min   Nilda RiggsNancy Carolyn Martin, [facility] PrGNP, [facility], APRN  [facility] [address], [address] [place], [place] [address] [phone]

## 2015-03-25 NOTE — Progress Notes (Signed)
I have reviewed and agreed above plan. 

## 2015-03-31 ENCOUNTER — Encounter: Payer: Self-pay | Admitting: Podiatry

## 2015-03-31 ENCOUNTER — Ambulatory Visit (INDEPENDENT_AMBULATORY_CARE_PROVIDER_SITE_OTHER): Payer: BLUE CROSS/BLUE SHIELD | Admitting: Podiatry

## 2015-03-31 VITALS — BP 129/76 | HR 60 | Resp 12

## 2015-03-31 DIAGNOSIS — M6789 Other specified disorders of synovium and tendon, multiple sites: Secondary | ICD-10-CM | POA: Diagnosis not present

## 2015-03-31 DIAGNOSIS — M2041 Other hammer toe(s) (acquired), right foot: Secondary | ICD-10-CM

## 2015-03-31 DIAGNOSIS — M2031 Hallux varus (acquired), right foot: Secondary | ICD-10-CM

## 2015-03-31 DIAGNOSIS — Q6650 Congenital pes planus, unspecified foot: Secondary | ICD-10-CM

## 2015-03-31 DIAGNOSIS — M76829 Posterior tibial tendinitis, unspecified leg: Secondary | ICD-10-CM

## 2015-04-03 ENCOUNTER — Encounter: Payer: Self-pay | Admitting: Podiatry

## 2015-04-03 NOTE — Progress Notes (Signed)
Patient ID: Isaac ReichmannWinfred Hildebrandt, male   DOB: 03/04/1956, 59 y.o.   MRN: [id number]  Subjective: 59 year old male presents the office today after picking up [place] brace. He states that overall he believes the brace is doing better. He does state that it does rub some when wearing his shoe. He has taken out the insert in his shoe and replaced it with just the brace which seems to help. He also states he continues to get a callus on the inside portion of the left big toe per which he tries to trim between visits. Denies any redness or drainage from the area. No other complaints at this time.  Objective: AAO 3, NAD DP/PT pulses 2/4, CRT less than 3 seconds Protective sensation intact with Simms Weinstein monofilament On the right side ankle joint range of motion is intact however subtalar joint motion continues to be restricted is crepitation with range of motion have there is not pain with range of motion of this time. Metatarsal MPJ range of motion is intact. The hallux IPJ since a hallux malleus physician. There is an assisted hyperkeratotic lesion on the medial aspect of the hallux on the right side. Upon debridement there is no underlying ulceration, drainage or other signs of infection. There is no areas of tenderness to bilateral lower extremities at this time. There is no overlying edema, erythema, increase in warmth. There is no pain with calf compression, swelling, warmth, erythema. There is in a brace appears to be fitting well and there is no signs of rubbing on the skin or signs of irritation.  Assessment: 59 year old male with right symptomatic flat foot deformity, arthritis; hallux malleus  Plan: -Treatment options discussed including all alternatives, risks, and complications -I discussed that if his brace continues to wrap he needs to follow-up with Betha to see if it needs to be modified. We can set this up for him when she is back in the office. -Hyperkeratotic lesion sharply debrided  without complication/bleeding. -Continue supportive shoe gear. -Follow-up in the near future sooner if any problems are to arise. Call with any questions or concerns in the meantime.  Ovid CurdMatthew Wagoner, DPM

## 2015-04-12 ENCOUNTER — Ambulatory Visit: Payer: BLUE CROSS/BLUE SHIELD | Admitting: *Deleted

## 2015-04-12 DIAGNOSIS — M76829 Posterior tibial tendinitis, unspecified leg: Secondary | ICD-10-CM

## 2015-04-12 NOTE — Progress Notes (Signed)
Patient ID: Irena ReichmannWinfred Fryberger, male   DOB: 01/12/1956, 59 y.o.   MRN: [id number] [facility]. Adjustment was needed to shoe not brace.  Adjustment was made and patient will follow as needed.

## 2015-04-13 ENCOUNTER — Telehealth: Payer: Self-pay | Admitting: Internal Medicine

## 2015-04-13 NOTE — Telephone Encounter (Signed)
[facility]. Referring provider is requesting that patient see Dr. Leone PayorGessner or Dr. Marina GoodellPerry. Records placed on Dr. Marvell FullerGessner's desk for review.

## 2015-06-12 NOTE — Telephone Encounter (Signed)
Dr. Leone PayorGessner reviewed records and has accepted patient. Ok to schedule [facility]. I have spoke to patient and he states that he will call back to schedule.

## 2015-06-26 NOTE — Telephone Encounter (Signed)
Appointment has not been scheduled yet. Records will be in "records reviewed" folder.

## 2015-09-20 ENCOUNTER — Ambulatory Visit (INDEPENDENT_AMBULATORY_CARE_PROVIDER_SITE_OTHER): Payer: BLUE CROSS/BLUE SHIELD | Admitting: Nurse Practitioner

## 2015-09-20 ENCOUNTER — Encounter: Payer: Self-pay | Admitting: Nurse Practitioner

## 2015-09-20 VITALS — BP 151/79 | HR 51 | Ht 68.0 in | Wt 261.0 lb

## 2015-09-20 DIAGNOSIS — R569 Unspecified convulsions: Secondary | ICD-10-CM

## 2015-09-20 MED ORDER — OXCARBAZEPINE 300 MG PO TABS: 450.0000 mg | ORAL_TABLET | Freq: Every day | ORAL | Status: DC

## 2015-09-20 NOTE — Patient Instructions (Signed)
Continue Oxcarbazepine 1.5 tablets or 450 mg  Daily Reviewed recent labs, sodium level 138.  Follow-up yearly and when necessary

## 2015-09-20 NOTE — Progress Notes (Signed)
I have reviewed and agreed above plan. 

## 2015-09-20 NOTE — Progress Notes (Signed)
[facility]  PATIENT: Isaac Lozano DOB: 12/21/1955   REASON FOR VISIT: Follow-up for seizure disorder HISTORY FROM: Patient    HISTORY OF PRESENT ILLNESS:HISTORY Isaac Lozano is a 60 y.o. male here as a referral from Dr. [name] for seizure disorder. Initial visit with Dr. Hosie Poisson in Jan 2015. He previously was followed by Dr Elenora Fender for seizures, he is retiring, so patient is transitioning care. He had left temporal lobectomy in 1991 at [facility] [place], for seizures. He has to quit driving for 3 years prior to have surgery due to his frequent seizure activities. 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 Has not had a generalized seizure since since his surgery in 1991. He will occasionally have auras, can happen a few times a month. Aura described as a "clammy" sensation, some paresthesias in perioral region, some anxiety. Episodes last a few minutes and then resolve. 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 He also complains of multiple joints pain, low back pain, getting epidural injection occasionally, which has been helpful, He reported history of obstructive sleep apnea, could not tolerate CPAP machine the past, he still has lots snoring, wake up occasionally during the nighttime, today ESS is 9, FSS is 29  UPDATE August 24 2014:YY Repeat EEG August 12 2014, was abnormal, there was mild slowing involving left frontal temporal region, with intermittent sharp transient, indicating focal irritability Laboratory evaluation March 23 2014, normal CBC, normal CMP, A1c 6.1 He still has intermittent episodes of aura, numbness of frontal teeth, feel the travelling warmness in his face,  blood warm in his arms, lasting less than 2 minutes, anxious feeling, once every 2 months While taking Trileptal was increased to  1/2 in noon and one tab po qhs, mild goggy. He has signs of obstructive sleep apnea, he is going through dental work,   UPDATE 03/22/2015 [name], 60 year old male returns for follow-up. He was last seen in the office by Dr.Yan 08/24/2014 . He has history of seizure disorder and is currently on Trileptal 450 daily. He occasionally has an aura but no seizure activity. He may get flushing of his face. He just had labs done at primary care yesterday, I do not have access to those. He also has a history of obstructive sleep apnea unable to use the machine in the past. He realizes that if he loses weight that will make a significant difference. He does snore. He also has a history of chronic back pain managed by pain management. 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 He returns for reevaluation UPDATE 09/20/2015 [name], 60 year old male returns for follow-up. He has a history of seizure disorder for many years and is currently on Trileptal 450 mg daily. He has not had any overt seizure activity however he will occasionally have an aura usually when stressed. He can get flushing of his face. He had recent labs at primary care office January. Sodium level was 138. He also has a history of obstructive sleep apnea but does not use CPAP machine. His weight has remained stable however he needs to lose 50-60 pounds. He has a history of chronic back pain and ankle pain. He continues to work full-time. He returns  for reevaluation   REVIEW OF SYSTEMS: Full 14 system review of systems performed and notable only for those listed, all others are neg:  Constitutional: neg  Cardiovascular: neg Ear/Nose/Throat: neg  Skin: neg Eyes: neg Respiratory: neg Gastroitestinal: neg  Hematology/Lymphatic: neg  Endocrine: neg Musculoskeletal: Joint  pain Allergy/Immunology: neg Neurological: neg Psychiatric: Depression and anxiety Sleep : History of obstructive sleep apnea does not use CPAP   ALLERGIES: No Known Allergies  HOME MEDICATIONS: Outpatient Prescriptions Prior to Visit  Medication Sig Dispense Refill  . acetaminophen (TYLENOL) 500 MG tablet Take 500 mg by mouth every 6 (six) hours as needed.    . Alum Hydroxide-Mag Carbonate (GAVISCON PO) Take 1 tablet by mouth daily as needed.     . diclofenac (VOLTAREN) 75 MG EC tablet Take 75 mg by mouth 2 (two) times daily.     . famotidine (PEPCID) 20 MG tablet Take 20 mg by mouth 2 (two) times daily.     Marland Kitchen. gabapentin (NEURONTIN) 300 MG capsule Take 300 mg by mouth 2 (two) times daily.     Marland Kitchen. levothyroxine (SYNTHROID, LEVOTHROID) 88 MCG tablet Take 88 mcg by mouth daily before breakfast.    . loratadine (CLARITIN) 10 MG tablet Take 10 mg by mouth daily as needed.     . Multiple Vitamin (MULTIVITAMIN) tablet Take 1 tablet by mouth daily.    . Oxcarbazepine (TRILEPTAL) 300 MG tablet Take 1.5 tablets (450 mg total) by mouth daily. 21 tablet 1   No facility-administered medications prior to visit.    PAST MEDICAL HISTORY: Past Medical History  Diagnosis Date  . SOB (shortness of breath)     Chronic   . Elevated CPK   . Muscle pain     Chronic  . Hypothyroidism   . Sleep apnea   . Depression   . HTN (hypertension)   . Seizure disorder (HCC)   . Scoliosis   . GERD (gastroesophageal reflux disease)   . Hypokalemia   . Low testosterone   . Venous insufficiency   . Lumbar radiculopathy     right sided  . Osteoarthritis     both knees  . Morbid obesity (HCC)   . Sleep apnea     PAST SURGICAL HISTORY: Past Surgical History  Procedure Laterality Date  . Knee surgery      Left  . Brain surgery    . Ventral hernia repair      FAMILY HISTORY: Family History  Problem Relation Age of Onset  . Cancer Father     SOCIAL HISTORY: Social History   Social History  .  Marital Status: Single    Spouse Name: N/A  . Number of Children: 1  . Years of Education: Associates   Occupational History  . rest manager    Social History Main Topics  . Smoking status: Never Smoker   . Smokeless tobacco: Never Used  . Alcohol Use: No  . Drug Use: No  . Sexual Activity: Not on file   Other Topics Concern  . Not on file   Social History Narrative   Patient lives at home alone, has 1 child   Patient is right handed   Education level is Associates degree   Caffeine consumption is 2-3 daily     PHYSICAL EXAM  Filed Vitals:   09/20/15 1130  BP: 151/79  Pulse: 51  Height: 5\' 8"  (1.727 m)  Weight: 261 lb (118.389 kg)   Body mass index is 39.69 kg/(m^2). Generalized: Well developed,  obese male in no acute distress  Head: normocephalic and atraumatic,. Oropharynx benign  Neck: Supple, no carotid bruits  Cardiac: Regular rate rhythm, no murmur  Musculoskeletal: No deformity   Neurological examination   Mentation: Alert oriented to time, place, history taking. Attention span and concentration appropriate. Recent and remote memory intact. Follows all commands speech and language fluent.   Cranial nerve II-XII: Pupils were equal round reactive to light extraocular movements were full, visual field were full on confrontational test. Facial sensation and strength were normal. hearing was intact to finger rubbing bilaterally. Uvula tongue midline. head turning and shoulder shrug were normal and symmetric.Tongue protrusion into cheek strength was normal. Motor: normal bulk and tone, full strength in the BUE, BLE, fine finger movements normal, no pronator drift. No focal weakness Sensory: normal and symmetric to light touch, in the upper and lower extremities  Coordination: finger-nose-finger, heel-to-shin bilaterally, no dysmetria Reflexes: Brachioradialis 2/2, biceps 2/2, triceps 2/2, patellar 2/2, Achilles 2/2, plantar responses were flexor  bilaterally. Gait and Station: Rising up from seated position without assistance, normal stance, moderate stride, good arm swing, smooth turning, able to perform tiptoe, and heel walking without difficulty. Tandem gait is steady  l DIAGNOSTIC DATA (LABS, IMAGING, TESTING) - ASSESSMENT AND PLAN  60 y.o. year old male  has a past medical history of SOB (shortness of breath); Elevated CPK; Muscle pain; Hypothyroidism; Sleep apnea; Depression; HTN (hypertension); Seizure disorder (HCC);  Venous insufficiency; Lumbar radiculopathy; Osteoarthritis; Morbid obesity (HCC); and Sleep apnea. here to follow-up for his seizure disorder. He has not had any overt seizure activity however he will occasionally have aura especially if stressed  Continue Oxcarbazepine 1.5 tablets or 450 mg  Daily Reviewed recent labs, sodium level 138.  Follow-up yearly and when necessary Encouraged weight loss for overall health and well-being Nilda Riggs, [facility], [facility], APRN  [facility] [address], [address] [place], [place] [address] [phone]

## 2016-03-25 ENCOUNTER — Telehealth: Payer: Self-pay | Admitting: Nurse Practitioner

## 2016-03-25 NOTE — Telephone Encounter (Signed)
Pt called said the gabapentin (NEURONTIN) 300 MG capsule and Oxcarbazepine (TRILEPTAL) 300 MG tablet ar causing sleepiness. He has read recently that gabapentin could bring on auras. Please call

## 2016-03-25 NOTE — Telephone Encounter (Signed)
Called patient back. He wanted to know if it was safe to take gabapentin and trileptal together. He states he is very tired all the time. He states he has had a couple auras but has not had any seizures. Advised I will [facility] and call back to advise by tomorrow at the latest. He verbalized understanding.

## 2016-03-26 NOTE — Telephone Encounter (Signed)
Called and LVM for pt relaying per CM,NP that he can take medication at the same time. Gave [facility] phone number if he has further questions.  Advised him to call if sx worsen/has new sx or any seizures.

## 2016-03-26 NOTE — Telephone Encounter (Signed)
Ok to take at the same time

## 2016-04-02 ENCOUNTER — Ambulatory Visit: Payer: BLUE CROSS/BLUE SHIELD | Admitting: Nurse Practitioner

## 2016-04-04 ENCOUNTER — Ambulatory Visit: Payer: BLUE CROSS/BLUE SHIELD | Admitting: Nurse Practitioner

## 2016-04-08 ENCOUNTER — Encounter: Payer: Self-pay | Admitting: Nurse Practitioner

## 2016-04-24 ENCOUNTER — Encounter: Payer: Self-pay | Admitting: Nurse Practitioner

## 2016-04-24 ENCOUNTER — Ambulatory Visit (INDEPENDENT_AMBULATORY_CARE_PROVIDER_SITE_OTHER): Payer: BLUE CROSS/BLUE SHIELD | Admitting: Nurse Practitioner

## 2016-04-24 VITALS — BP 156/80 | HR 60 | Resp 20 | Ht 68.0 in | Wt 256.0 lb

## 2016-04-24 DIAGNOSIS — R569 Unspecified convulsions: Secondary | ICD-10-CM | POA: Diagnosis not present

## 2016-04-24 DIAGNOSIS — G4733 Obstructive sleep apnea (adult) (pediatric): Secondary | ICD-10-CM

## 2016-04-24 NOTE — Patient Instructions (Addendum)
Will set up for sleep study, pt was dx 14 years ago never compliant  Will obtain a Trileptal level I explained in particular the risks and ramifications of untreated moderate to severe OSA, especially with respect to cardiovascular disease  including congestive heart failure, difficult to treat hypertension, cardiac arrhythmias, or stroke. Even type 2 diabetes has, in part, been linked to untreated OSA. Symptoms of untreated OSA include daytime sleepiness, memory problems, mood irritability and mood disorder such as depression and anxiety, lack of energy, as well as recurrent headaches, especially morning headaches. We talked about trying to maintain a healthy lifestyle in general, as well as the importance of weight control. I encouraged the patient to eat healthy, exercise daily and keep well hydrated, to keep a scheduled bedtime and wake time routine, to not skip any meals and eat healthy snacks in between meals F/U with me in 6 months

## 2016-04-24 NOTE — Progress Notes (Signed)
[facility]  PATIENT: Isaac Lozano DOB: 12-Jun-1955   REASON FOR VISIT: Follow-up for seizure disorder History of obstructive sleep apnea  HISTORY FROM: Patient    HISTORY OF PRESENT ILLNESS:HISTORY Isaac Lozano is a 60 y.o. male here as a referral from Dr. [name] for seizure disorder. Initial visit with Dr. Hosie Poisson in Jan 2015. He previously was followed by Dr Elenora Fender for seizures, he is retiring, so patient is transitioning care. He had left temporal lobectomy in 1991 at [facility], for seizures. He has to quit driving for 3 years prior to have surgery due to his frequent seizure activities. 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 Has not had a generalized seizure since since his surgery in 1991. He will occasionally have auras, can happen a few times a month. Aura described as a "clammy" sensation, some paresthesias in perioral region, some anxiety. Episodes last a few minutes and then resolve. 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 He also complains of multiple joints pain, low back pain, getting epidural injection occasionally, which has been helpful, He reported history of obstructive sleep apnea, could not tolerate CPAP machine the past, he still has lots snoring, wake up occasionally during the nighttime, today ESS is 9, FSS is 12  UPDATE August 24 2014:YY Repeat EEG August 12 2014, was abnormal, there was mild slowing involving left frontal temporal region, with intermittent sharp transient, indicating focal irritability Laboratory evaluation March 23 2014, normal CBC, normal CMP, A1c 6.1 He still has intermittent episodes of aura, numbness of frontal teeth, feel the  travelling warmness in his face, blood warm in his arms, lasting less than 2 minutes, anxious feeling, once every 2 months While taking Trileptal was increased to 300mg  1/2 in noon and one tab po qhs, mild goggy. He has signs of obstructive sleep apnea, he is going through dental work,   UPDATE 03/22/2015 Isaac Lozano, 60 year old male returns for follow-up. He was last seen in the office by Dr.Yan 08/24/2014 . He has history of seizure disorder and is currently on Trileptal 450 daily. He occasionally has an aura but no seizure activity. He may get flushing of his face. He just had labs done at primary care yesterday, I do not have access to those. He also has a history of obstructive sleep apnea unable to use the machine in the past. He realizes that if he loses weight that will make a significant difference. He does snore. He also has a history of chronic back pain managed by pain management. 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 He returns for reevaluation UPDATE 09/20/2015 [name], 60 year old male returns for follow-up. He has a history of seizure disorder for many years and is currently on Trileptal 450 mg daily. He has not had any overt seizure activity however he will occasionally have an aura usually when stressed. He can get flushing of his face. He had recent labs at primary care office January. Sodium level was 138. He also has a history of obstructive sleep apnea but does not use CPAP machine. His weight has remained stable however he needs to lose 50-60 pounds. He has a history of chronic back pain and ankle pain. He  continues to work full-time. He returns for reevaluation UPDATE 11/22/2017[name], 60 year old male returns for follow-up. He has a history of seizure disorder and is currently on Trileptal 450 daily. He has not had any overt seizure activity however he will occasionally have an aura especially when stressed. He will get flushing of his face. He had  recent labs at his primary care in October CMP was within normal limits sodium 138. CBC within normal limits. He has a long history of obstructive sleep apnea but only for a mask for about 2 days. He was claustrophobic. He is morbidly obese but his weight has stayed fairly stable. He also has a history of chronic back pain managed by pain management. He receives epidurals about every 3-4 months. He gets no regular exercise. 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, theatre/television/filmmanager for a [employer] he returns for reevaluation  REVIEW OF SYSTEMS: Full 14 system review of systems performed and notable only for those listed, all others are neg:  Constitutional: neg  Cardiovascular: neg Ear/Nose/Throat: neg  Skin: neg Eyes: neg Respiratory: neg Gastroitestinal: neg  Hematology/Lymphatic: neg  Endocrine: neg Musculoskeletal: Joint pain, back pain  Allergy/Immunology: neg Neurological: neg Psychiatric: neg Sleep : History of obstructive sleep apnea does not use CPAP   ALLERGIES: No Known Allergies  HOME MEDICATIONS: Outpatient Medications Prior to Visit  Medication Sig Dispense Refill  . acetaminophen (TYLENOL) 500 MG tablet Take 500 mg by mouth every 6 (six) hours as needed.    . diclofenac (VOLTAREN) 75 MG EC tablet Take 75 mg by mouth 2 (two) times daily.     . famotidine (PEPCID) 20 MG tablet Take 20 mg by mouth 2 (two) times daily.     Marland Kitchen. gabapentin (NEURONTIN) 300 MG capsule Take 300 mg by mouth 2 (two) times daily.     Marland Kitchen. levothyroxine (SYNTHROID, LEVOTHROID) 88 MCG tablet Take 88 mcg by mouth daily before breakfast.    . loratadine (CLARITIN) 10 MG tablet Take 10 mg by mouth daily as needed.     . Multiple Vitamin (MULTIVITAMIN) tablet Take 1 tablet by mouth daily.    . Oxcarbazepine (TRILEPTAL) 300 MG tablet Take 1.5 tablets (450 mg total) by mouth daily. 135 tablet 3  . Alum Hydroxide-Mag Carbonate (GAVISCON PO) Take 1 tablet by mouth daily as needed.      No facility-administered medications  prior to visit.     PAST MEDICAL HISTORY: Past Medical History:  Diagnosis Date  . Depression   . Elevated CPK   . GERD (gastroesophageal reflux disease)   . HTN (hypertension)   . Hypokalemia   . Hypothyroidism   . Low testosterone   . Lumbar radiculopathy    right sided  . Morbid obesity (HCC)   . Muscle pain    Chronic  . Osteoarthritis    both knees  . Scoliosis   . Seizure disorder (HCC)   . Sleep apnea   . Sleep apnea   . SOB (shortness of breath)    Chronic   . Venous insufficiency     PAST SURGICAL HISTORY: Past Surgical History:  Procedure Laterality Date  . BRAIN SURGERY    . KNEE SURGERY     Left  . VENTRAL HERNIA REPAIR      FAMILY HISTORY: Family History  Problem Relation Age of Onset  . Cancer Father     SOCIAL HISTORY: Social History   Social History  . Marital status: Single    Spouse name: N/A  .  Number of children: 1  . Years of education: Associates   Occupational History  . rest manager [employer]   Social History Main Topics  . Smoking status: Never Smoker  . Smokeless tobacco: Never Used  . Alcohol use No  . Drug use: No  . Sexual activity: Not on file   Other Topics Concern  . Not on file   Social History Narrative   Patient lives at home alone, has 1 child   Patient is right handed   Education level is Associates degree   Caffeine consumption is 2-3 daily     PHYSICAL EXAM  Vitals:   04/24/16 1123  BP: (!) 156/80  Pulse: 60  Resp: 20  Weight: 256 lb (116.1 kg)  Height: 5\' 8"  (1.727 m)   Body mass index is 38.92 kg/m. Generalized: Well developed, obese male in no acute distress  Head: normocephalic and atraumatic,. Oropharynx benign  Neck: Supple, no carotid bruits  Cardiac: Regular rate rhythm, no murmur  Musculoskeletal: No deformity   Neurological examination   Mentation: Alert oriented to time, place, history taking. Attention span and concentration appropriate. Recent and remote memory  intact. Follows all commands speech and language fluent.   Cranial nerve II-XII: Pupils were equal round reactive to light extraocular movements were full, visual field were full on confrontational test. Facial sensation and strength were normal. hearing was intact to finger rubbing bilaterally. Uvula tongue midline. head turning and shoulder shrug were normal and symmetric.Tongue protrusion into cheek strength was normal. Motor: normal bulk and tone, full strength in the BUE, BLE, fine finger movements normal, no pronator drift. No focal weakness Sensory: normal and symmetric to light touch, in the upper and lower extremities  Coordination: finger-nose-finger, heel-to-shin bilaterally, no dysmetria Reflexes:Symmetric upper and lower  , plantar responses were flexor bilaterally. Gait and Station: Rising up from seated position without assistance, normal stance, moderate stride, good arm swing, smooth turning, able to perform tiptoe, and heel walking without difficulty. Tandem gait is steady   DIAGNOSTIC DATA (LABS, IMAGING, TESTING) - ASSESSMENT AND PLAN  60 y.o. year old male  has a past medical history of SOB (shortness of breath); Elevated CPK; Muscle pain; Hypothyroidism;  HTN (hypertension); Seizure disorder (HCC);  Venous insufficiency; Lumbar radiculopathy; Osteoarthritis; Morbid obesity (HCC); and Sleep apnea. here to follow-up for his seizure disorder. He has not had any overt seizure activity however he will occasionally have aura especially if stressed.The patient is a current patient of Dr. Terrace Arabia  who is out of the office today . This note is sent to the work in doctor.     PLAN: Will set up for sleep study, pt was dx 14 years ago never compliant  Will obtain a Trileptal level I explained in particular the risks and ramifications of untreated moderate to severe OSA, especially with respect to cardiovascular disease  including congestive heart failure, difficult to treat hypertension,  cardiac arrhythmias, or stroke. Even type 2 diabetes has, in part, been linked to untreated OSA. Symptoms of untreated OSA include daytime sleepiness, memory problems, mood irritability and mood disorder such as depression and anxiety, lack of energy, as well as recurrent headaches, especially morning headaches. We talked about trying to maintain a healthy lifestyle in general, as well as the importance of weight control. I encouraged the patient to eat healthy, exercise daily and keep well hydrated, to keep a scheduled bedtime and wake time routine, to not skip any meals and eat healthy snacks in between meals F/U  with me in 6 months Nilda RiggsNancy Carolyn Wright Gravely, [facility]NP, [facility], APRN  [facility] [address], [address] [place], [place] [address] [phone]

## 2016-04-24 NOTE — Progress Notes (Signed)
I have read the note, and I agree with the clinical assessment and plan.  Richard A. Sater, MD, PhD Certified in Neurology, Clinical Neurophysiology, Sleep Medicine, Pain Medicine and Neuroimaging  [facility] [address], [address] [place], [place] [address] [phone]  

## 2016-04-27 LAB — 10-HYDROXYCARBAZEPINE: Oxcarbazepine SerPl-Mcnc: 6 ug/mL — ABNORMAL LOW (ref 10–35)

## 2016-04-30 ENCOUNTER — Telehealth: Payer: Self-pay | Admitting: *Deleted

## 2016-04-30 NOTE — Telephone Encounter (Signed)
Spoke to pt and let him know that his labs were ok.  He verbalized understanding.

## 2016-04-30 NOTE — Telephone Encounter (Signed)
-----   Message from Nilda RiggsNancy Carolyn Martin, NP sent at 04/29/2016  8:08 AM EST ----- Labs ok please call the patient

## 2016-05-16 ENCOUNTER — Ambulatory Visit (INDEPENDENT_AMBULATORY_CARE_PROVIDER_SITE_OTHER): Payer: BLUE CROSS/BLUE SHIELD | Admitting: Neurology

## 2016-05-16 ENCOUNTER — Encounter: Payer: Self-pay | Admitting: Neurology

## 2016-05-16 VITALS — BP 152/82 | HR 58 | Resp 20 | Ht 68.0 in | Wt 252.0 lb

## 2016-05-16 DIAGNOSIS — R351 Nocturia: Secondary | ICD-10-CM

## 2016-05-16 DIAGNOSIS — R569 Unspecified convulsions: Secondary | ICD-10-CM

## 2016-05-16 DIAGNOSIS — R400 Somnolence: Secondary | ICD-10-CM

## 2016-05-16 DIAGNOSIS — G4733 Obstructive sleep apnea (adult) (pediatric): Secondary | ICD-10-CM | POA: Diagnosis not present

## 2016-05-16 NOTE — Patient Instructions (Signed)
Based on your symptoms and your exam I believe you are still at risk for obstructive sleep apnea or OSA, and I think we should proceed with a sleep study to determine how severe it is. If you have more than mild OSA, I want you to consider treatment with CPAP. Please remember, the risks and ramifications of moderate to severe obstructive sleep apnea or OSA are: Cardiovascular disease, including congestive heart failure, stroke, difficult to control hypertension, arrhythmias, and even type 2 diabetes has been linked to untreated OSA. Sleep apnea causes disruption of sleep and sleep deprivation in most cases, which, in turn, can cause recurrent headaches, problems with memory, mood, concentration, focus, and vigilance. Most people with untreated sleep apnea report excessive daytime sleepiness, which can affect their ability to drive. Please do not drive if you feel sleepy.   I will likely see you back after your sleep study to go over the test results and where to go from there. We will call you after your sleep study to advise about the results (most likely, you will hear from Diana, my nurse) and to set up an appointment at the time, as necessary.    Our sleep lab administrative assistant, Dawn will meet with you or call you to schedule your sleep study. If you don't hear back from her by next week please feel free to call her at [phone]. This is her direct line and please leave a message with your phone number to call back if you get the voicemail box. She will call back as soon as possible.   

## 2016-05-16 NOTE — Progress Notes (Signed)
Subjective:    Patient ID: Isaac Lozano is a 60 y.o. male.  HPI     Huston FoleySaima Laney Louderback, MD, PhD [facility] [address], [address] [address] [place], [place] [id number]  Dear Isaac Lozano and Isaac EwingYijun,   I saw your patient, Isaac ReichmannWinfred Lozano, upon your kind request in my clinic today for initial consultation of his sleep disorder, in particular, concern for underlying obstructive sleep apnea. The patient is unaccompanied today. As you know, Mr. Isaac Lozano is a 60 year old right-handed gentleman with an underlying complex medical history of reflux disease, hypertension, hypothyroidism, low testosterone, lumbar radiculopathy, OA, PTSD, Seizure disorder (stable on medications, s/p L temporal lobectomy in 1991), and morbid obesity, who was previously diagnosed with OSA, but has not been on CPAP for years. He was diagnosed over ten years ago. I reviewed a home sleep test from 03/21/2010 through 03/23/2010 which showed a total AHI between 19 and 33 per hour. I reviewed your office note from 04/24/2016. He says that he was briefly on CPAP but could not tolerate it due to exacerbation of anxiety, and would dread going to bed.  A CPAP download is not available for my review today. His Epworth sleepiness score is 10 out of 24, his fatigue score is 42 out of 63. He has had seizure auras, but no recent Seizures since the seizure surgery.  He is apprehensive about sleep study testing and has financial concerns. He weighed 249 lb at the time of his HST. Original OSA Dx and CPAP trial was in 2003. He was able to lose weight and was down to 190 lb at one point in 2004, snoring and EDS improved.  He suspects that his sister has OSA. He has occasional RLS symptoms, while watching TV in the evening, maybe restless sleeper.  Divorced, one grown son, one teenaged GD, lives alone, non smoker, no EtOH, caffeine in the form of soda and tea. Works in Armed forces operational officerrestaurant management. He does not always wake up rested. Denies morning  headaches, has nocturia about twice per average night.  His Past Medical History Is Significant For: Past Medical History:  Diagnosis Date  . Depression   . Elevated CPK   . GERD (gastroesophageal reflux disease)   . HTN (hypertension)   . Hypokalemia   . Hypothyroidism   . Low testosterone   . Lumbar radiculopathy    right sided  . Morbid obesity (HCC)   . Muscle pain    Chronic  . Osteoarthritis    both knees  . Scoliosis   . Seizure disorder (HCC)   . Sleep apnea   . Sleep apnea   . SOB (shortness of breath)    Chronic   . Venous insufficiency     His Past Surgical History Is Significant For: Past Surgical History:  Procedure Laterality Date  . BRAIN SURGERY    . KNEE SURGERY     Left  . VENTRAL HERNIA REPAIR      His Family History Is Significant For: Family History  Problem Relation Age of Onset  . Cancer Father     His Social History Is Significant For: Social History   Social History  . Marital status: Single    Spouse name: N/A  . Number of children: 1  . Years of education: Associates   Occupational History  . rest manager [employer]   Social History Main Topics  . Smoking status: Never Smoker  . Smokeless tobacco: Never Used  . Alcohol use No  . Drug  use: No  . Sexual activity: Not Asked   Other Topics Concern  . None   Social History Narrative   Patient lives at home alone, has 1 child   Patient is right handed   Education level is Associates degree   Caffeine consumption is 2-3 daily    His Allergies Are:  No Known Allergies:   His Current Medications Are:  Outpatient Encounter Prescriptions as of 05/16/2016  Medication Sig  . acetaminophen (TYLENOL) 500 MG tablet Take 500 mg by mouth every 6 (six) hours as needed.  . diclofenac (VOLTAREN) 75 MG EC tablet Take 75 mg by mouth 2 (two) times daily.   . famotidine (PEPCID) 20 MG tablet Take 20 mg by mouth 2 (two) times daily.   Marland Kitchen gabapentin (NEURONTIN) 300 MG capsule Take 300 mg  by mouth 2 (two) times daily.   Marland Kitchen levothyroxine (SYNTHROID, LEVOTHROID) 88 MCG tablet Take 88 mcg by mouth daily before breakfast.  . loratadine (CLARITIN) 10 MG tablet Take 10 mg by mouth daily as needed.   . Multiple Vitamin (MULTIVITAMIN) tablet Take 1 tablet by mouth daily.  . Oxcarbazepine (TRILEPTAL) 300 MG tablet Take 1.5 tablets (450 mg total) by mouth daily.   No facility-administered encounter medications on file as of 05/16/2016.   :  Review of Systems:  Out of a complete 14 point review of systems, all are reviewed and negative with the exception of these symptoms as listed below:  Review of Systems  Neurological: Positive for seizures.       Pt presents today to discuss the possibility of having a sleep study. Pt reports that he had a sleep study about 5 years ago and he tried cpap for a short amount of time following the study, but he could not tolerate it. He lost weight instead, and that seemed to help.   Epworth Sleepiness Scale 0= would never doze 1= slight chance of dozing 2= moderate chance of dozing 3= high chance of dozing  Sitting and reading: 1 Watching TV: 2 Sitting inactive in a public place (ex. Theater or meeting): 2 As a passenger in a car for an hour without a break: 1 Lying down to rest in the afternoon: 2 Sitting and talking to someone: 1 Sitting quietly after lunch (no alcohol): 1 In a car, while stopped in traffic: 0 Total: 10     Objective:  Neurologic Exam  Physical Exam Physical Examination:   Vitals:   05/16/16 1125  BP: (!) 152/82  Pulse: (!) 58  Resp: 20    General Examination: The patient is a very pleasant 60 y.o. male in no acute distress. He appears well-developed and well-nourished and well groomed.   HEENT: Normocephalic, atraumatic, pupils are equal, round and reactive to light and accommodation. Funduscopic exam is normal with sharp disc margins noted. Extraocular tracking is good without limitation to gaze excursion or  nystagmus noted. Normal smooth pursuit is noted. Hearing is grossly intact. Tympanic membranes are clear bilaterally. Face is symmetric with normal facial animation and normal facial sensation. Speech is clear with no dysarthria noted. There is no hypophonia. There is no lip, neck/head, jaw or voice tremor. Neck is supple with full range of passive and active motion. There are no carotid bruits on auscultation. Oropharynx exam reveals: moderate mouth dryness, adequate dental hygiene and moderate airway crowding, due to smaller airway, redundant soft palate. Mallampati is class II. Tongue protrudes centrally and palate elevates symmetrically. Tonsils are 1+ in size. Neck size is  18 5/8 inches. He has a Mild overbite.    Chest: Clear to auscultation without wheezing, rhonchi or crackles noted.  Heart: S1+S2+0, regular and normal without murmurs, rubs or gallops noted.   Abdomen: Soft, non-tender and non-distended with normal bowel sounds appreciated on auscultation.  Extremities: There is no pitting edema in the distal lower extremities bilaterally. R ankle slightly larger. Lower body appears relatively shorter than upper body.   Skin: Warm and dry without trophic changes noted.  Musculoskeletal: exam reveals no obvious joint deformities, tenderness or joint swelling or erythema. Right ankle slightly larger than left, unremarkable knee surgery scar on the left.  Neurologically:  Mental status: The patient is awake, alert and oriented in all 4 spheres. His immediate and remote memory, attention, language skills and fund of knowledge are appropriate. There is no evidence of aphasia, agnosia, apraxia or anomia. Speech is clear with normal prosody and enunciation. Thought process is linear. Mood is normal and affect is normal.  Cranial nerves II - XII are as described above under HEENT exam. In addition: shoulder shrug is normal with equal shoulder height noted. Motor exam: Normal bulk, strength and tone  is noted. There is no drift, tremor or rebound. Romberg is negative. Reflexes are 2+ in the upper extremities and trace in the lower extremities, absent in the left knee. Fine motor skills and coordination: intact with normal finger taps, normal hand movements, normal rapid alternating patting, normal foot taps and normal foot agility.  Cerebellar testing: No dysmetria or intention tremor on finger to nose testing. Heel to shin is unremarkable bilaterally. There is no truncal or gait ataxia.  Sensory exam: intact to light touch in the upper and lower extremities.  Gait, station and balance: He stands easily. No veering to one side is noted. No leaning to one side is noted. Posture is age-appropriate and stance is narrow based. Gait shows mild limp, slight waddle.                Assessment and Plan:  In summary, Isaac Lozano is a very pleasant 60 y.o.-year old male with an underlying complex medical history of reflux disease, hypertension, hypothyroidism, low testosterone, lumbar radiculopathy, OA, PTSD, Seizure disorder (stable on medications, s/p L temporal lobectomy in 1991), and morbid obesity, Who was previously diagnosed with obstructive sleep apnea, most likely in the moderate or moderate to severe degree. He was briefly on CPAP therapy over 10 years ago but could not tolerate it at the time. He also had an interim home sleep test that confirmed moderate or moderate to severe obstructive sleep apnea. Given his medical history and his seizure diagnosis, I think it is imperative that he be tested and most likely be treated again. I had a long chat with the patient about my findings and the diagnosis of OSA, its prognosis and treatment options. We talked about medical treatments, surgical interventions and non-pharmacological approaches. I explained in particular the risks and ramifications of untreated moderate to severe OSA, especially with respect to developing cardiovascular disease down the Road,  including congestive heart failure, difficult to treat hypertension, cardiac arrhythmias, or stroke. Even type 2 diabetes has, in part, been linked to untreated OSA. Symptoms of untreated OSA include daytime sleepiness, memory problems, mood irritability and mood disorder such as depression and anxiety, lack of energy, as well as recurrent headaches, especially morning headaches. We talked about trying to maintain a healthy lifestyle in general, as well as the importance of weight control. I encouraged the  patient to eat healthy, exercise daily and keep well hydrated, to keep a scheduled bedtime and wake time routine, to not skip any meals and eat healthy snacks in between meals. I advised the patient not to drive when feeling sleepy. I recommended the following at this time: sleep study with potential positive airway pressure titration. (We will score hypopneas at 3% and split the sleep study into diagnostic and treatment portion, if the estimated. 2 hour AHI is >15/h).   I explained the sleep test procedure to the patient and also outlined possible surgical and non-surgical treatment options of OSA, including the use of a custom-made dental device (which would require a referral to a specialist dentist or oral surgeon), upper airway surgical options, such as pillar implants, radiofrequency surgery, tongue base surgery, and UPPP (which would involve a referral to an ENT surgeon). Rarely, jaw surgery such as mandibular advancement may be considered.  I also explained the CPAP treatment option to the patient, who indicated that he would be apprehensive, but willing to try CPAP again, if the need arises. I explained the importance of being compliant with PAP treatment, not only for insurance purposes but primarily to improve His symptoms, and for the patient's long term health benefit, including to reduce His cardiovascular risks. I answered all his questions today and the patient was in agreement. I would like  to see him back after the sleep study is completed and encouraged him to call with any interim questions, concerns, problems or updates.   Thank you very much for allowing me to participate in the care of this nice patient. If I can be of any further assistance to you please do not hesitate to talk to me.  Sincerely,   Huston Foley, MD, PhD

## 2016-08-05 ENCOUNTER — Ambulatory Visit: Payer: BLUE CROSS/BLUE SHIELD | Admitting: Podiatry

## 2016-08-29 ENCOUNTER — Ambulatory Visit (INDEPENDENT_AMBULATORY_CARE_PROVIDER_SITE_OTHER): Payer: BLUE CROSS/BLUE SHIELD | Admitting: Podiatry

## 2016-08-29 DIAGNOSIS — Q828 Other specified congenital malformations of skin: Secondary | ICD-10-CM

## 2016-08-29 DIAGNOSIS — L84 Corns and callosities: Secondary | ICD-10-CM

## 2016-08-29 DIAGNOSIS — M79673 Pain in unspecified foot: Secondary | ICD-10-CM

## 2016-08-29 DIAGNOSIS — M79671 Pain in right foot: Secondary | ICD-10-CM

## 2016-08-30 NOTE — Progress Notes (Signed)
Subjective: 61 year old male presents the office today for concerns of a thick callus to the right big toe. He states this been growing the last couple of months and has become painful pressure in shoes. He denies any redness or drainage or any swelling. He states that otherwise he is doing well with the brace 7 no other problems to his ankle or feet Denies any systemic complaints such as fevers, chills, nausea, vomiting. No acute changes since last appointment, and no other complaints at this time.   Objective: AAO x3, NAD DP/PT pulses palpable bilaterally, CRT less than 3 seconds Hyperkeratotic tissues present on the plantar medial aspect of the right hallux. This is thick. Upon debridement there is no underlying pulse rate shoe, drainage or any conical signs of infection. The remainder of exam is unchanged. There is no other areas of tenderness identified at this time. No open lesions or pre-ulcerative lesions.  No pain with calf compression, swelling, warmth, erythema  Assessment: Hypereratotic lesion right hallux due to biomechanical changes.   Plan: -All treatment options discussed with the patient including all alternatives, risks, complications.  -Hyperkeratotic lesion sharply debrided without complications or bleeding. Offloading pads were dispensed. -RTC as needed. Call any questions concerns.   Ovid Curd, DPm

## 2016-09-09 ENCOUNTER — Telehealth: Payer: Self-pay

## 2016-09-09 NOTE — Telephone Encounter (Signed)
Called patient to schedule sleep study 3 times. Have not heard back.  

## 2016-09-22 ENCOUNTER — Other Ambulatory Visit: Payer: Self-pay | Admitting: Nurse Practitioner

## 2016-10-22 ENCOUNTER — Ambulatory Visit: Payer: BLUE CROSS/BLUE SHIELD | Admitting: Nurse Practitioner

## 2016-10-23 ENCOUNTER — Encounter: Payer: Self-pay | Admitting: Nurse Practitioner

## 2016-11-22 ENCOUNTER — Telehealth: Payer: Self-pay | Admitting: Podiatry

## 2016-11-22 NOTE — Telephone Encounter (Signed)
Tried calling pt back in regards to a medical records request. Left message for him to call me back.

## 2016-11-26 ENCOUNTER — Encounter: Payer: Self-pay | Admitting: Podiatry

## 2016-11-26 NOTE — Progress Notes (Signed)
26 November 2016 Patient picked up requested copies of X-rays on CD and office visit notes. [facility]

## 2016-12-06 DIAGNOSIS — M76821 Posterior tibial tendinitis, right leg: Secondary | ICD-10-CM | POA: Insufficient documentation

## 2017-01-14 DIAGNOSIS — Z79899 Other long term (current) drug therapy: Secondary | ICD-10-CM | POA: Insufficient documentation

## 2017-02-10 ENCOUNTER — Ambulatory Visit: Payer: BLUE CROSS/BLUE SHIELD | Admitting: Nurse Practitioner

## 2017-02-20 NOTE — Telephone Encounter (Signed)
Patient has never rescheduled. Records have been shredded. °

## 2017-03-26 NOTE — Progress Notes (Signed)
[facility]  PATIENT: [name] DOB: 12-Jun-1955   REASON FOR VISIT: Follow-up for seizure disorder History of obstructive sleep apnea  HISTORY FROM: Patient    HISTORY OF PRESENT ILLNESS:HISTORY Navar Thorstenson is a 61 y.o. male here as a referral from Dr. [name] for seizure disorder. Initial visit with Dr. Hosie Poisson in Jan 2015. He previously was followed by Dr Elenora Fender for seizures, he is retiring, so patient is transitioning care. He had left temporal lobectomy in 1991 at [facility] [place], for seizures. He has to quit driving for 3 years prior to have surgery due to his frequent seizure activities. 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 Has not had a generalized seizure since since his surgery in 1991. He will occasionally have auras, can happen a few times a month. Aura described as a "clammy" sensation, some paresthesias in perioral region, some anxiety. Episodes last a few minutes and then resolve. 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 He also complains of multiple joints pain, low back pain, getting epidural injection occasionally, which has been helpful, He reported history of obstructive sleep apnea, could not tolerate CPAP machine the past, he still has lots snoring, wake up occasionally during the nighttime, today ESS is 9, FSS is 12  UPDATE August 24 2014:YY Repeat EEG August 12 2014, was abnormal, there was mild slowing involving left frontal temporal region, with intermittent sharp transient, indicating focal irritability Laboratory evaluation March 23 2014, normal CBC, normal CMP, A1c 6.1 He still has intermittent episodes of aura, numbness of frontal teeth, feel the  travelling warmness in his face, blood warm in his arms, lasting less than 2 minutes, anxious feeling, once every 2 months While taking Trileptal was increased to 300mg  1/2 in noon and one tab po qhs, mild goggy. He has signs of obstructive sleep apnea, he is going through dental work,   UPDATE 03/22/2015 Mr Stepanski, 61 year old male returns for follow-up. He was last seen in the office by Dr.Yan 08/24/2014 . He has history of seizure disorder and is currently on Trileptal 450 daily. He occasionally has an aura but no seizure activity. He may get flushing of his face. He just had labs done at primary care yesterday, I do not have access to those. He also has a history of obstructive sleep apnea unable to use the machine in the past. He realizes that if he loses weight that will make a significant difference. He does snore. He also has a history of chronic back pain managed by pain management. 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 He returns for reevaluation UPDATE 09/20/2015 CMMr. Vukovich, 61 year old male returns for follow-up. He has a history of seizure disorder for many years and is currently on Trileptal 450 mg daily. He has not had any overt seizure activity however he will occasionally have an aura usually when stressed. He can get flushing of his face. He had recent labs at primary care office January. Sodium level was 138. He also has a history of obstructive sleep apnea but does not use CPAP machine. His weight has remained stable however he needs to lose 50-60 pounds. He has a history of chronic back pain and ankle pain. He  continues to work full-time. He returns for reevaluation UPDATE 11/22/2017CM Mr. Lyda PeroneKirk, 61 year old male returns for follow-up. He has a history of seizure disorder and is currently on Trileptal 450 daily. He has not had any overt seizure activity however he will occasionally have an aura especially when stressed. He will get flushing of his face. He had  recent labs at his primary care in October CMP was within normal limits sodium 138. CBC within normal limits. He has a long history of obstructive sleep apnea but only for a mask for about 2 days. He was claustrophobic. He is morbidly obese but his weight has stayed fairly stable. He also has a history of chronic back pain managed by pain management. He receives epidurals about every 3-4 months. He gets no regular exercise. 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 Lyda PeroneKirk 61 year old  He has not had any generalized seizure events years however he will occasionally have an aura especially when stressed. He will become flushed in the face. He is currently on Trileptal 300 mg 1-1/2 tablets daily.  He is due for labs at his primary care on 04/02/17.  He has a long history of obstructive sleep apnea but was not able to use the mask.  He is supposed to see Dr. Myrtis SerKatz for a dental appliance.He had  surgery to his right ankle in July ,  He has had pain for 5-6 years from an old trauma.Right lower extremity in a boot he returns for reevaluaion REVIEW OF SYSTEMS: Full 14 system review of systems performed and notable only for those listed, all others are neg:  Constitutional:  fatigue  Cardiovascular: neg Ear/Nose/Throat: neg  Skin: neg Eyes: neg Respiratory:  Shortness of breath Gastroitestinal: neg  Hematology/Lymphatic: neg  Endocrine: neg Musculoskeletal: Joint pain, back pain  Allergy/Immunology: neg Neurological: neg Psychiatric:  depression and anxiety Sleep : History of obstructive sleep apnea does not use CPAP   ALLERGIES: No Known Allergies  HOME MEDICATIONS: Outpatient Medications Prior to Visit  Medication Sig Dispense Refill  . acetaminophen (TYLENOL) 500 MG tablet Take 500 mg by mouth every 6 (six) hours as needed.    . diclofenac (VOLTAREN) 75 MG EC tablet Take 75 mg by mouth 2 (two) times daily.     . famotidine  (PEPCID) 20 MG tablet Take 20 mg by mouth 2 (two) times daily.     Marland Kitchen. levothyroxine (SYNTHROID, LEVOTHROID) 88 MCG tablet Take 88 mcg by mouth daily before breakfast.    . loratadine (CLARITIN) 10 MG tablet Take 10 mg by mouth daily as needed.     . Multiple Vitamin (MULTIVITAMIN) tablet Take 1 tablet by mouth daily.    . Oxcarbazepine (TRILEPTAL) 300 MG tablet TAKE ONE AND ONE-HALF TABLETS DAILY 135 tablet 3  . gabapentin (NEURONTIN) 300 MG capsule Take 300 mg by mouth 2 (two) times daily.      No facility-administered medications prior to visit.     PAST MEDICAL HISTORY: Past Medical History:  Diagnosis Date  . Depression   . Elevated CPK   . GERD (gastroesophageal reflux disease)   . HTN (hypertension)   . Hypokalemia   . Hypothyroidism   . Low testosterone   . Lumbar radiculopathy    right sided  . Morbid obesity (HCC)   . Muscle pain    Chronic  . Osteoarthritis    both knees  . Scoliosis   . Seizure disorder (HCC)   .  Sleep apnea   . Sleep apnea   . SOB (shortness of breath)    Chronic   . Venous insufficiency     PAST SURGICAL HISTORY: Past Surgical History:  Procedure Laterality Date  . BRAIN SURGERY    . KNEE SURGERY     Left  . VENTRAL HERNIA REPAIR      FAMILY HISTORY: Family History  Problem Relation Age of Onset  . Cancer Father     SOCIAL HISTORY: Social History   Social History  . Marital status: Single    Spouse name: N/A  . Number of children: 1  . Years of education: Associates   Occupational History  . rest manager [employer]   Social History Main Topics  . Smoking status: Never Smoker  . Smokeless tobacco: Never Used  . Alcohol use No  . Drug use: No  . Sexual activity: Not on file   Other Topics Concern  . Not on file   Social History Narrative   Patient lives at home alone, has 1 child   Patient is right handed   Education level is Associates degree   Caffeine consumption is 2-3 daily     PHYSICAL EXAM  Vitals:    03/27/17 1254  BP: (!) 166/91 154/89 after 10 min  Pulse: 64  Weight: 257 lb 9.6 oz (116.8 kg)  Height: 5\' 8"  (1.727 m)   Body mass index is 39.17 kg/m. Generalized: Well developed, obese male in no acute distress  Head: normocephalic and atraumatic,. Oropharynx benign  Neck: Supple,  Musculoskeletal: No deformity   Neurological examination   Mentation: Alert oriented to time, place, history taking. Attention span and concentration appropriate. Recent and remote memory intact. Follows all commands speech and language fluent.   Cranial nerve II-XII: Pupils were equal round reactive to light extraocular movements were full, visual field were full on confrontational test. Facial sensation and strength were normal. hearing was intact to finger rubbing bilaterally. Uvula tongue midline. head turning and shoulder shrug were normal and symmetric.Tongue protrusion into cheek strength was normal. Motor: normal bulk and tone, full strength in the BUE, BLE, Sensory: normal and symmetric to light touch, in the upper and lower extremities  Coordination: finger-nose-finger, heel-to-shin bilaterally, no dysmetria, no tremor Reflexes:Symmetric upper and lower  , plantar responses were flexor bilaterally. Gait and Station: Rising up from seated position without assistance, normal stance, moderate stride, good arm swing, smooth turning, boot  to right lower leg  DIAGNOSTIC DATA (LABS, IMAGING, TESTING) - ASSESSMENT AND PLAN  61 y.o. year old male  has a past medical history of SOB (shortness of breath); Elevated CPK; Muscle pain; Hypothyroidism;  HTN (hypertension); Seizure disorder (HCC);  Venous insufficiency; Lumbar radiculopathy; Osteoarthritis; Morbid obesity (HCC); and Sleep apnea. here to follow-up for his seizure disorder. He has not had any overt seizure activity however he will occasionally have aura especially if stressed.  PLAN:  Will obtain a Trileptal level at PCP office next week  please send to our office CBC and CMP   To monitor adverse effects of Trileptal please send to our office Call for any seizure activity Follow up with Dr. Myrtis Ser for dental appliance for OSA Follow-up yearly and when necessary Nilda Riggs, [facility], [facility], APRN  [facility] [address], [address] [place], [place] [address] [phone]

## 2017-03-27 ENCOUNTER — Encounter: Payer: Self-pay | Admitting: Nurse Practitioner

## 2017-03-27 ENCOUNTER — Ambulatory Visit (INDEPENDENT_AMBULATORY_CARE_PROVIDER_SITE_OTHER): Payer: BLUE CROSS/BLUE SHIELD | Admitting: Nurse Practitioner

## 2017-03-27 ENCOUNTER — Encounter (INDEPENDENT_AMBULATORY_CARE_PROVIDER_SITE_OTHER): Payer: Self-pay

## 2017-03-27 VITALS — BP 154/89 | HR 58 | Ht 68.0 in | Wt 257.6 lb

## 2017-03-27 DIAGNOSIS — G4733 Obstructive sleep apnea (adult) (pediatric): Secondary | ICD-10-CM

## 2017-03-27 DIAGNOSIS — R569 Unspecified convulsions: Secondary | ICD-10-CM | POA: Diagnosis not present

## 2017-03-27 MED ORDER — OXCARBAZEPINE 300 MG PO TABS: 450.0000 mg | ORAL_TABLET | Freq: Every day | ORAL | 3 refills | Status: DC

## 2017-03-27 NOTE — Progress Notes (Signed)
I have reviewed and agreed above plan. 

## 2017-03-27 NOTE — Patient Instructions (Signed)
Will obtain a Trileptal level at PCP office next week  CBC and CMP as well please send to our office Call for any seizure activity Follow-up yearly and when necessary

## 2017-04-02 DIAGNOSIS — N528 Other male erectile dysfunction: Secondary | ICD-10-CM | POA: Insufficient documentation

## 2017-04-07 ENCOUNTER — Telehealth: Payer: Self-pay | Admitting: Nurse Practitioner

## 2017-04-07 MED ORDER — OXCARBAZEPINE 300 MG PO TABS: 300.0000 mg | ORAL_TABLET | Freq: Two times a day (BID) | ORAL | 6 refills | Status: DC

## 2017-04-07 NOTE — Telephone Encounter (Signed)
Trileptal level low at 5.  Please increase Trileptal to 300 mg twice daily.  Will refill BMP and CBC okay

## 2017-04-07 NOTE — Telephone Encounter (Signed)
More than likely due to low level of Trileptal

## 2017-04-07 NOTE — Telephone Encounter (Signed)
Called patient and informed him that Enid Skeens Martin, NP increased his trileptal to 300 mg twice daily. He stated that he uses [facility], gets 90 day supply. This RN advised it was sent to [facility] so he can begin now. He stated he has 300 mg tabs but will call [facility] and call back only if he has issue. He then stated that if he needs EEG he wants it done this year before his insurance renews. He stated that he has been having auras so maybe another EEG needs to be done to find cause. This RN stated this will be sent to NP for her reply re; EEG. Patient verbalized understanding, appreciation.

## 2017-04-07 NOTE — Telephone Encounter (Signed)
Patient had lab work in [place] and said medication Oxcarbazepine (TRILEPTAL) 300 MG tablet showed up low. Does he need to increase dosage? I advised Eber JonesCarolyn is out of the office today but he wanted me to send to her nurse and have her check with Eber Jonesarolyn tomorrow.

## 2017-04-07 NOTE — Addendum Note (Signed)
Addended by: Lynder ParentsMARTIN, Ahja Martello C on: 04/07/2017 04:06 PM   Modules accepted: Orders

## 2017-04-07 NOTE — Telephone Encounter (Signed)
Received lab results form patient's PCP. Placed on C Martin NP's desk for review, reply.

## 2017-04-07 NOTE — Telephone Encounter (Signed)
Called patient and advised him that  Enid Skeens Martin, NP needs all lab results from his PCP. Gave him fax number. He stated he would call and have labs faxed over for NP's review. This RN stated she would call him back with NP's reply. He verbalized understanding of call.

## 2017-04-08 NOTE — Telephone Encounter (Signed)
LVM informing patient that at this time Enid Skeens Martin, NP stated his symptoms are most likely due to the low level of Trileptal. She does not recommend an EEG at this time.  This RN advised if he has worsening of symptoms or concerns to call back. Left office number.

## 2017-04-08 NOTE — Telephone Encounter (Addendum)
Called patient who stated he wondered if he needed EEG due to his past brain surgery and sleep apnea. He stated that he wasn't sure if it needed to be done and he has met insurance deductible this year. He also stated he forgot to tell NP that he has ringing in his ears that starts later in day.  He then stated that he saw an orthodontist and was fitted for a device to help with his sleep apnea. This RN stated would discuss with NP and let him know. He verbalized understanding, appreciation.  Called patient and advised.  Advised that NP was glad to know he has been fitted for dental device. He questioned about the ringing in his ears. This RN stated that there could be many causes, and if he feels it is worse or he cannot tolerate, his PCP can refer him to ENT for evaluation. He verbalized understanding, appreciation.

## 2017-04-08 NOTE — Telephone Encounter (Signed)
Pt asking for a call back from RN Corrie DandyMary C to discuss message left earlier this afternoon

## 2017-06-06 ENCOUNTER — Telehealth: Payer: Self-pay | Admitting: Nurse Practitioner

## 2017-06-06 NOTE — Telephone Encounter (Signed)
Pt has called re: his Oxcarbazepine (TRILEPTAL) 300 MG tablet, he states his employer is wanting him to switch to 3rd shift but he is concerned as to how that will affect him re: this medication.  Pt is asking if NP Eber JonesCarolyn does not feel it is a good idea to switch his shift re: this medication to please write a note stating so.  Pt states if she feels he should be okay either way to please advise.  Pt is asking for a call back.  Pt is aware that NP Carolyn's RN is not in office today and will await a call back

## 2017-06-09 NOTE — Telephone Encounter (Signed)
His seizure med should not be affected

## 2017-06-09 NOTE — Telephone Encounter (Signed)
I spoke to pt and he relayed that his employer is trying to get him to change to third shift.   He is concerned that this change may not be in his best interest.   I relayed that the only medication that we prescribe is the trileptal (he states his combination of meds that he takes (which he has taken on a routine basis for a long period of time) it would possibly effect him negatively.  Would you do this note for him?

## 2017-06-09 NOTE — Telephone Encounter (Signed)
I LMVM for pt that due to his sz medication this should not be an issue for working 3rd shift.  He may want to call his pcp re: gabapentin and see if they will do the note he wi requesting.  He may call back if needed.

## 2017-06-09 NOTE — Telephone Encounter (Signed)
Pt has called back asking that if a decision is made today he is willing to come by the office this evening for the note. Pt states he is return to work on [employer].  Please call

## 2017-06-12 NOTE — Telephone Encounter (Signed)
Late Entry:  Pt called back 06-10-17 and asked about the note.  I relayed that the medication that we prescribe for him, sz medication should not be an issue with working 3rd shift.  I told him that we would not do the note and to contact his pcp.  He verbalized that he will do (but taking all the medications together was the issue for him and he was concerned that this would be a problem for him after so long on this daytime regimen).

## 2017-10-07 ENCOUNTER — Telehealth: Payer: Self-pay | Admitting: Nurse Practitioner

## 2017-10-07 NOTE — Telephone Encounter (Signed)
FYI

## 2017-10-07 NOTE — Telephone Encounter (Signed)
Pt has called because of Isaac Lozano feeling that he has been experiencing off an on from previous seizure activity.  Pt states that the feeling is not bad but he would like to see about having another EEG, he does not recall when he had his last.  Pt states he would like a call to see if maybe there could be an adjustment made to his medication.  Pt has accepted 1st available office visit with Dr Terrace Arabia and is also on wait list.  Please call

## 2017-10-07 NOTE — Telephone Encounter (Addendum)
Spoke with patient and informed him that Isaac Lozano stated his auras are not new and often associated with stress. He stated his job has been stressful, but he stated his mind is thinking straight. He does report lightheadedness and "a little sweaty feeling" with auras. He is not working full time, is on [employer] retirement but stated he needs to work. He stated he recently found out about the unexpected death of friend; he saw it at 2 am after getting home from work. This RN advised that is very stressful as well.  Advised him his last EEG was abnormal; NP doesn't think it needs to be repeated at this time.  Advised him Isaac Lozano never received  lab results from PCP. Patient stated he has appointment with his PCP on 10/13/17. He took fax number to this RN's pod to have PCP fax lab results for NP to review. Advised him he is on wait list for Dr Terrace Arabia, and this RN will route this to [facility] RN for Dr Terrace Arabia. Advised that Marcelino Duster will call him to let him know if Dr Terrace Arabia feels he needs to be seen sooner than July. Patient verbalized understanding, appreciation.

## 2017-10-07 NOTE — Telephone Encounter (Signed)
He has had the auras before. I did not receive his labs from PCP after last office visit. EEG has been abnormal in the past. He can be put on wait list for Terrace Arabia since his f/u is with her.

## 2017-10-23 DIAGNOSIS — N182 Chronic kidney disease, stage 2 (mild): Secondary | ICD-10-CM | POA: Insufficient documentation

## 2017-12-02 ENCOUNTER — Encounter: Payer: Self-pay | Admitting: Neurology

## 2017-12-02 ENCOUNTER — Encounter

## 2017-12-02 ENCOUNTER — Ambulatory Visit (INDEPENDENT_AMBULATORY_CARE_PROVIDER_SITE_OTHER): Payer: BLUE CROSS/BLUE SHIELD | Admitting: Neurology

## 2017-12-02 VITALS — BP 163/95 | HR 67 | Ht 68.0 in | Wt 266.5 lb

## 2017-12-02 DIAGNOSIS — G8929 Other chronic pain: Secondary | ICD-10-CM | POA: Insufficient documentation

## 2017-12-02 DIAGNOSIS — F329 Major depressive disorder, single episode, unspecified: Secondary | ICD-10-CM | POA: Diagnosis not present

## 2017-12-02 DIAGNOSIS — F32A Depression, unspecified: Secondary | ICD-10-CM | POA: Insufficient documentation

## 2017-12-02 DIAGNOSIS — M5441 Lumbago with sciatica, right side: Secondary | ICD-10-CM | POA: Diagnosis not present

## 2017-12-02 DIAGNOSIS — G40209 Localization-related (focal) (partial) symptomatic epilepsy and epileptic syndromes with complex partial seizures, not intractable, without status epilepticus: Secondary | ICD-10-CM | POA: Diagnosis not present

## 2017-12-02 MED ORDER — OXCARBAZEPINE 300 MG PO TABS: 300.0000 mg | ORAL_TABLET | Freq: Two times a day (BID) | ORAL | 4 refills | Status: DC

## 2017-12-02 MED ORDER — CITALOPRAM HYDROBROMIDE 10 MG PO TABS: 10.0000 mg | ORAL_TABLET | Freq: Every day | ORAL | 11 refills | Status: DC

## 2017-12-02 NOTE — Patient Instructions (Signed)
Melatonin 3mg  or 1/2 of 5mg  tablet

## 2017-12-02 NOTE — Progress Notes (Signed)
[facility]  PATIENT: Isaac Lozano DOB: [birth year]  HISTORY OF PRESENT ILLNESS:HISTORY Trampus Mcquerry is a 62 y.o. male here as a referral from Dr. [name] for seizure disorder. Initial visit with Dr. Hosie Poisson in Jan 2015. He previously was followed by Dr Elenora Fender for seizures, he is retiring, so patient is transitioning care. He had left temporal lobectomy in 1991 at [facility] [place], for seizures. He has to quit driving for 3 years prior to have surgery due to his frequent seizure activities. 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 Has not had a generalized seizure since since his surgery in 1991. He will occasionally have auras, can happen a few times a month. Aura described as a "clammy" sensation, some paresthesias in perioral region, some anxiety. Episodes last a few minutes and then resolve. 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 He also complains of multiple joints pain, low back pain, getting epidural injection occasionally, which has been helpful, He reported history of obstructive sleep apnea, could not tolerate CPAP machine the past, he still has lots snoring, wake up occasionally during the nighttime, today ESS is 9, FSS is 70  UPDATE August 24 2014:YY Repeat EEG August 12 2014, was abnormal, there was mild slowing involving left frontal temporal region, with intermittent sharp transient, indicating focal irritability Laboratory evaluation March 23 2014, normal CBC, normal CMP, A1c 6.1 He still has intermittent episodes of aura, numbness of frontal teeth, feel the travelling warmness in his face, blood warm in his arms, lasting less than 2 minutes, anxious feeling, once every 2  months While taking Trileptal was increased to 300mg  1/2 in noon and one tab po qhs, mild goggy. 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, he reported irregular sleep pattern, often wake up multiple times, feel fatigued, has constant bilateral ear tinnitus, he is tearful during today's interview, complains of worsening depression, gets sensitive upset easily,  He continue have once or twice each months recurrent sensation of tightness in his throat, sweaty, anxiety feelings, lasting for few minutes, he is able to communicate during the process, very similar to the prodrome he had with his previous partial seizure, but no loss of consciousness, no significant confusion, is tolerating Trileptal 1 twice daily well, does not want to increase medications,  REVIEW OF SYSTEMS: Full 14 system review of systems performed and notable only for those listed, all others are neg:    ALLERGIES: No Known Allergies  HOME MEDICATIONS: Outpatient Medications Prior to Visit  Medication Sig Dispense Refill  . acetaminophen (TYLENOL) 500 MG tablet Take 1,000 mg by mouth every 6 (six) hours as needed.     . diclofenac (VOLTAREN) 75 MG EC tablet Take 75 mg by mouth 2 (two) times daily.     . famotidine (PEPCID) 20 MG tablet Take 20 mg by mouth 2 (two) times daily.     Marland Kitchen gabapentin (NEURONTIN) 100 MG capsule Take 100 mg by mouth 2 (two) times daily.    Marland Kitchen levothyroxine (SYNTHROID, LEVOTHROID) 88 MCG tablet Take 88 mcg by mouth daily before breakfast.    .  loratadine (CLARITIN) 10 MG tablet Take 10 mg by mouth daily as needed.     . Multiple Vitamin (MULTIVITAMIN) tablet Take 1 tablet by mouth daily.    . Oxcarbazepine (TRILEPTAL) 300 MG tablet Take 1 tablet (300 mg total) 2 (two) times daily by mouth. 60 tablet 6   No facility-administered medications prior to visit.     PAST MEDICAL HISTORY: Past Medical  History:  Diagnosis Date  . Depression   . Elevated CPK   . GERD (gastroesophageal reflux disease)   . HTN (hypertension)   . Hypokalemia   . Hypothyroidism   . Low testosterone   . Lumbar radiculopathy    right sided  . Morbid obesity (HCC)   . Muscle pain    Chronic  . Osteoarthritis    both knees  . Scoliosis   . Seizure disorder (HCC)   . Sleep apnea   . Sleep apnea   . SOB (shortness of breath)    Chronic   . Venous insufficiency     PAST SURGICAL HISTORY: Past Surgical History:  Procedure Laterality Date  . BRAIN SURGERY    . KNEE SURGERY     Left  . VENTRAL HERNIA REPAIR      FAMILY HISTORY: Family History  Problem Relation Age of Onset  . Cancer Father     SOCIAL HISTORY: Social History   Socioeconomic History  . Marital status: Single    Spouse name: Not on file  . Number of children: 1  . Years of education: Associates  . Highest education level: Not on file  Occupational History  . Occupation: Engineer, technical sales: [employer]  Social Needs  . Financial resource strain: Not on file  . Food insecurity:    Worry: Not on file    Inability: Not on file  . Transportation needs:    Medical: Not on file    Non-medical: Not on file  Tobacco Use  . Smoking status: Never Smoker  . Smokeless tobacco: Never Used  Substance and Sexual Activity  . Alcohol use: No  . Drug use: No  . Sexual activity: Not on file  Lifestyle  . Physical activity:    Days per week: Not on file    Minutes per session: Not on file  . Stress: Not on file  Relationships  . Social connections:    Talks on phone: Not on file    Gets together: Not on file    Attends religious service: Not on file    Active member of club or organization: Not on file    Attends meetings of clubs or organizations: Not on file    Relationship status: Not on file  . Intimate partner violence:    Fear of current or ex partner: Not on file    Emotionally abused: Not on file     Physically abused: Not on file    Forced sexual activity: Not on file  Other Topics Concern  . Not on file  Social History Narrative   Patient lives at home alone, has 1 child   Patient is right handed   Education level is Associates degree   Caffeine consumption is 2-3 daily     PHYSICAL EXAM  Vitals:   03/27/17 1254  BP: (!) 166/91 154/89 after 10 min  Pulse: 64  Weight: 257 lb 9.6 oz (116.8 kg)  Height: 5\' 8"  (1.727 m)   Body mass index is 40.52 kg/m. Generalized: Well developed, obese male in  no acute distress  Head: normocephalic and atraumatic,. Oropharynx benign  Neck: Supple,  Musculoskeletal: No deformity   Neurological examination   Mentation: Alert oriented to time, place, history taking. Attention span and concentration appropriate. Recent and remote memory intact. Follows all commands speech and language fluent.   Cranial nerve II-XII: Pupils were equal round reactive to light extraocular movements were full, visual field were full on confrontational test. Facial sensation and strength were normal. hearing was intact to finger rubbing bilaterally. Uvula tongue midline. head turning and shoulder shrug were normal and symmetric.Tongue protrusion into cheek strength was normal. Motor: normal bulk and tone, full strength in the BUE, BLE, Sensory: normal and symmetric to light touch, in the upper and lower extremities  Coordination: finger-nose-finger, heel-to-shin bilaterally, no dysmetria, no tremor Reflexes:Symmetric upper and lower  , plantar responses were flexor bilaterally. Gait and Station: Rising up from seated position without assistance, normal stance, moderate stride, good arm swing, smooth turning, boot  to right lower leg  DIAGNOSTIC DATA (LABS, IMAGING, TESTING) - ASSESSMENT AND PLAN  62 y.o. year old male  Complex partial seizure with secondary generalization, Status post left temporal lobectomy  Continue Trileptal 300 mg twice a  day Depression anxiety  Add on Celexa 10 mg daily  Intermittent right lower extremity muscle cramping  He was taking gabapentin 100 mg twice daily, I have advised him to stop taking gabapentin, to avoid the polypharmacy side effect  Moderate exercise, back stretching exercise, as needed NSAIDs  History of chronic low back pain, MRI of lumbar in 2013 showed right convex lumbar scoliosis, advanced lumbar degenerative spondylosis, left foraminal narrowing at L2-3, L3-4, right foraminal stenosis at L4-5,  Levert FeinsteinYijun Dearra Myhand, M.D. Ph.D.  [facility] [address] [place], [place] [address] Phone: [phone] Fax:      [phone]

## 2018-01-19 ENCOUNTER — Ambulatory Visit: Payer: BLUE CROSS/BLUE SHIELD | Admitting: Podiatry

## 2018-01-23 ENCOUNTER — Encounter: Payer: Self-pay | Admitting: Podiatry

## 2018-01-23 ENCOUNTER — Ambulatory Visit (INDEPENDENT_AMBULATORY_CARE_PROVIDER_SITE_OTHER): Payer: BLUE CROSS/BLUE SHIELD | Admitting: Podiatry

## 2018-01-23 DIAGNOSIS — N410 Acute prostatitis: Secondary | ICD-10-CM | POA: Insufficient documentation

## 2018-01-23 DIAGNOSIS — L84 Corns and callosities: Secondary | ICD-10-CM

## 2018-01-24 NOTE — Progress Notes (Signed)
Subjective: 62 year old male presents the office today for concerns of a thick callus to the right big toe as well as for the nail breaking.  He states the area is painful to get very thick but denies any current bleeding denies any surrounding redness or drainage or any signs of infection.  Since I last saw him he is also undergone [facility].  This is done well for him. Denies any systemic complaints such as fevers, chills, nausea, vomiting. No acute changes since last appointment, and no other complaints at this time.   Objective: AAO x3, NAD DP/PT pulses palpable bilaterally, CRT less than 3 seconds Thick hyperkeratotic lesion right medial hallux.  Upon debridement there is no underlying ulceration, drainage or any signs of infection noted today.  The right hallux toenail is dystrophic, mildly discolored standing.  There is no surrounding redness or drainage or any signs of infection. No open lesions or other pre-ulcerative lesions.  No pain with calf compression, swelling, warmth, erythema  Assessment: Right hallux hyperkeratotic lesion with onychodystrophy  Plan: -All treatment options discussed with the patient including all alternatives, risks, complications.  -I sharply debrided the hyperkeratotic lesion without any complications or bleeding.  Discussed moisturizer to the area daily he can use a pumice stone to help finally down.  He gets that he cannot be more than happy to treatment for him.  Discussed offloading and padding I dispensed offloading pads for him.  Also debrided the right hallux toenail without any complications or bleeding as a courtesy. -Patient encouraged to call the office with any questions, concerns, change in symptoms.   Vivi BarrackMatthew R Lozano DPM

## 2018-04-10 DIAGNOSIS — D539 Nutritional anemia, unspecified: Secondary | ICD-10-CM | POA: Insufficient documentation

## 2018-04-20 ENCOUNTER — Ambulatory Visit: Payer: BLUE CROSS/BLUE SHIELD | Admitting: Podiatry

## 2018-04-24 ENCOUNTER — Encounter: Payer: Self-pay | Admitting: Podiatry

## 2018-04-24 ENCOUNTER — Ambulatory Visit: Payer: BLUE CROSS/BLUE SHIELD | Admitting: Podiatry

## 2018-04-24 DIAGNOSIS — L84 Corns and callosities: Secondary | ICD-10-CM

## 2018-04-24 DIAGNOSIS — M79673 Pain in unspecified foot: Secondary | ICD-10-CM

## 2018-04-24 MED ORDER — UREA 40 % EX CREA: 1.0000 "application " | TOPICAL_CREAM | Freq: Every day | CUTANEOUS | 2 refills | Status: AC

## 2018-04-24 NOTE — Progress Notes (Signed)
Subjective: 62 year old male presents the office today for concerns of a thick callus to the right big toe.  He states that as a callus to stick it does hurt.  Denies any skin breakdown or redness or drainage or any swelling. Denies any systemic complaints such as fevers, chills, nausea, vomiting. No acute changes since last appointment, and no other complaints at this time.   Objective: AAO x3, NAD DP/PT pulses palpable bilaterally, CRT less than 3 seconds Thick hyperkeratotic lesion right medial hallux.  Upon debridement there is no underlying ulceration, drainage or any signs of infection noted today.  There is no surrounding redness or drainage or any signs of infection.  Mild hallux malleus present. No open lesions or other pre-ulcerative lesions.  No pain with calf compression, swelling, warmth, erythema  Assessment: Right hallux hyperkeratotic lesion   Plan: -All treatment options discussed with the patient including all alternatives, risks, complications.  -I sharply debrided the hyperkeratotic lesion without any complications or bleeding. Prescribed urea 40% cream.  Dispensed various offloading pads. -Patient encouraged to call the office with any questions, concerns, change in symptoms.   Vivi BarrackMatthew R Wagoner DPM
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
## 2018-06-17 ENCOUNTER — Ambulatory Visit: Payer: BLUE CROSS/BLUE SHIELD | Admitting: Nurse Practitioner

## 2018-06-17 ENCOUNTER — Telehealth: Payer: Self-pay

## 2018-06-17 NOTE — Telephone Encounter (Signed)
Patient was a no call/no show for their appointment today.   

## 2018-06-18 ENCOUNTER — Encounter: Payer: Self-pay | Admitting: Nurse Practitioner

## 2018-12-02 ENCOUNTER — Other Ambulatory Visit: Payer: Self-pay | Admitting: Neurology

## 2021-01-17 ENCOUNTER — Emergency Department (HOSPITAL_BASED_OUTPATIENT_CLINIC_OR_DEPARTMENT_OTHER): Payer: Medicare Other

## 2021-01-17 ENCOUNTER — Other Ambulatory Visit: Payer: Self-pay

## 2021-01-17 ENCOUNTER — Encounter (HOSPITAL_BASED_OUTPATIENT_CLINIC_OR_DEPARTMENT_OTHER): Payer: Self-pay

## 2021-01-17 ENCOUNTER — Emergency Department (HOSPITAL_BASED_OUTPATIENT_CLINIC_OR_DEPARTMENT_OTHER)
Admission: EM | Admit: 2021-01-17 | Discharge: 2021-01-17 | Disposition: A | Discharge status: Home / Self Care | Payer: Medicare Other | Attending: Emergency Medicine | Admitting: Emergency Medicine

## 2021-01-17 DIAGNOSIS — I10 Essential (primary) hypertension: Secondary | ICD-10-CM | POA: Diagnosis not present

## 2021-01-17 DIAGNOSIS — S0081XA Abrasion of other part of head, initial encounter: Secondary | ICD-10-CM | POA: Diagnosis present

## 2021-01-17 DIAGNOSIS — Z79899 Other long term (current) drug therapy: Secondary | ICD-10-CM | POA: Diagnosis not present

## 2021-01-17 DIAGNOSIS — E039 Hypothyroidism, unspecified: Secondary | ICD-10-CM | POA: Insufficient documentation

## 2021-01-17 MED ORDER — CYCLOBENZAPRINE HCL 5 MG PO TABS: 5.0000 mg | ORAL_TABLET | Freq: Three times a day (TID) | ORAL | 0 refills | Status: AC | PRN

## 2021-01-17 MED ORDER — LIDOCAINE 5 % EX PTCH: 1.0000 | MEDICATED_PATCH | CUTANEOUS | 0 refills | Status: AC
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
## 2021-11-16 ENCOUNTER — Ambulatory Visit: Payer: Self-pay | Admitting: Student

## 2021-11-16 DIAGNOSIS — E119 Type 2 diabetes mellitus without complications: Secondary | ICD-10-CM

## 2021-12-07 NOTE — Patient Instructions (Signed)
DUE TO COVID-19 ONLY TWO VISITORS  (aged 67 and older)  ARE ALLOWED TO COME WITH YOU AND STAY IN THE WAITING ROOM ONLY DURING PRE OP AND PROCEDURE.   **NO VISITORS ARE ALLOWED IN THE SHORT STAY AREA OR RECOVERY ROOM!!**  IF YOU WILL BE ADMITTED INTO THE HOSPITAL YOU ARE ALLOWED ONLY FOUR SUPPORT PEOPLE DURING VISITATION HOURS ONLY (7 AM -8PM)   The support person(s) must pass our screening, gel in and out, and wear a mask at all times, including in the patient's room. Patients must also wear a mask when staff or their support person are in the room. Visitors GUEST BADGE MUST BE WORN VISIBLY  One adult visitor may remain with you overnight and MUST be in the room by 8 P.M.     Your procedure is scheduled on: 12/19/21   Report to [facility] Main Entrance    Report to admitting at : 9:00 AM   Call this number if you have problems the morning of surgery [phone]   Do not eat food :After Midnight.   After Midnight you may have the following liquids until : 8:30 AM DAY OF SURGERY  Water Black Coffee (sugar ok, NO MILK/CREAM OR CREAMERS)  Tea (sugar ok, NO MILK/CREAM OR CREAMERS) regular and decaf                             Plain Jell-O (NO RED)                                           Fruit ices (not with fruit pulp, NO RED)                                     Popsicles (NO RED)                                                                  Juice: apple, WHITE grape, WHITE cranberry Sports drinks like Gatorade (NO RED) Clear broth(vegetable,chicken,beef)   Drink G2 drink AT : 8:30 AM the day of surgery.      The day of surgery:  Drink ONE (1) Pre-Surgery Clear Ensure or G2 at AM the morning of surgery. Drink in one sitting. Do not sip.  This drink was given to you during your hospital  pre-op appointment visit. Nothing else to drink after completing the  Pre-Surgery Clear Ensure or G2.          If you have questions, please contact your surgeon's office.    Oral  Hygiene is also important to reduce your risk of infection.                                    Remember - BRUSH YOUR TEETH THE MORNING OF SURGERY WITH YOUR REGULAR TOOTHPASTE   Do NOT smoke after Midnight   Take these medicines the morning of surgery with A SIP OF WATER: Trileptal,loratadine,synthroid.  How to Manage  Your Diabetes Before and After Surgery  Why is it important to control my blood sugar before and after surgery? Improving blood sugar levels before and after surgery helps healing and can limit problems. A way of improving blood sugar control is eating a healthy diet by:  Eating less sugar and carbohydrates  Increasing activity/exercise  Talking with your doctor about reaching your blood sugar goals High blood sugars (greater than 180 mg/dL) can raise your risk of infections and slow your recovery, so you will need to focus on controlling your diabetes during the weeks before surgery. Make sure that the doctor who takes care of your diabetes knows about your planned surgery including the date and location.  How do I manage my blood sugar before surgery? Check your blood sugar at least 4 times a day, starting 2 days before surgery, to make sure that the level is not too high or low. Check your blood sugar the morning of your surgery when you wake up and every 2 hours until you get to the Short Stay unit. If your blood sugar is less than 70 mg/dL, you will need to treat for low blood sugar: Do not take insulin. Treat a low blood sugar (less than 70 mg/dL) with  cup of clear juice (cranberry or apple), 4 glucose tablets, OR glucose gel. Recheck blood sugar in 15 minutes after treatment (to make sure it is greater than 70 mg/dL). If your blood sugar is not greater than 70 mg/dL on recheck, call [phone] for further instructions. Report your blood sugar to the short stay nurse when you get to Short Stay.  If you are admitted to the hospital after surgery: Your blood sugar  will be checked by the staff and you will probably be given insulin after surgery (instead of oral diabetes medicines) to make sure you have good blood sugar levels. The goal for blood sugar control after surgery is 80-180 mg/dL.   WHAT DO I DO ABOUT MY DIABETES MEDICATION?  Do not take oral diabetes medicines (pills) the morning of surgery.  THE NIGHT BEFORE SURGERY, take metformin as usual.     THE MORNING OF SURGERY, DO NOT TAKE ANY ORAL DIABETIC MEDICATIONS DAY OF YOUR SURGERY  Bring CPAP mask and tubing day of surgery.                              You may not have any metal on your body including hair pins, jewelry, and body piercing             Do not wear make-up, lotions, powders, perfumes/cologne, or deodorant  Do not wear nail polish including gel and S&S, artificial/acrylic nails, or any other type of covering on natural nails including finger and toenails. If you have artificial nails, gel coating, etc. that needs to be removed by a nail salon please have this removed prior to surgery or surgery may need to be canceled/ delayed if the surgeon/ anesthesia feels like they are unable to be safely monitored.   Do not shave  48 hours prior to surgery.               Men may shave face and neck.   Do not bring valuables to the hospital. [facility] IS NOT             RESPONSIBLE   FOR VALUABLES.   Contacts, dentures or bridgework may not be worn into surgery.  Bring small overnight bag day of surgery.   DO NOT BRING YOUR HOME MEDICATIONS TO THE HOSPITAL. PHARMACY WILL DISPENSE MEDICATIONS LISTED ON YOUR MEDICATION LIST TO YOU DURING YOUR ADMISSION IN THE HOSPITAL!    Patients discharged on the day of surgery will not be allowed to drive home.  Someone NEEDS to stay with you for the first 24 hours after anesthesia.   Special Instructions: Bring a copy of your healthcare power of attorney and living will documents         the day of surgery if you haven't scanned them  before.              Please read over the following fact sheets you were given: IF YOU HAVE QUESTIONS ABOUT YOUR PRE-OP INSTRUCTIONS PLEASE CALL [phone]     [facility] - Preparing for Surgery Before surgery, you can play an important role.  Because skin is not sterile, your skin needs to be as free of germs as possible.  You can reduce the number of germs on your skin by washing with CHG (chlorahexidine gluconate) soap before surgery.  CHG is an antiseptic cleaner which kills germs and bonds with the skin to continue killing germs even after washing. Please DO NOT use if you have an allergy to CHG or antibacterial soaps.  If your skin becomes reddened/irritated stop using the CHG and inform your nurse when you arrive at Short Stay. Do not shave (including legs and underarms) for at least 48 hours prior to the first CHG shower.  You may shave your face/neck. Please follow these instructions carefully:  1.  Shower with CHG Soap the night before surgery and the  morning of Surgery.  2.  If you choose to wash your hair, wash your hair first as usual with your  normal  shampoo.  3.  After you shampoo, rinse your hair and body thoroughly to remove the  shampoo.                           4.  Use CHG as you would any other liquid soap.  You can apply chg directly  to the skin and wash                       Gently with a scrungie or clean washcloth.  5.  Apply the CHG Soap to your body ONLY FROM THE NECK DOWN.   Do not use on face/ open                           Wound or open sores. Avoid contact with eyes, ears mouth and genitals (private parts).                       Wash face,  Genitals (private parts) with your normal soap.             6.  Wash thoroughly, paying special attention to the area where your surgery  will be performed.  7.  Thoroughly rinse your body with warm water from the neck down.  8.  DO NOT shower/wash with your normal soap after using and rinsing off  the CHG Soap.                 9.  Pat yourself dry with a clean towel.  10.  Wear clean pajamas.            11.  Place clean sheets on your bed the night of your first shower and do not  sleep with pets. Day of Surgery : Do not apply any lotions/deodorants the morning of surgery.  Please wear clean clothes to the hospital/surgery center.  FAILURE TO FOLLOW THESE INSTRUCTIONS MAY RESULT IN THE CANCELLATION OF YOUR SURGERY PATIENT SIGNATURE_________________________________  NURSE SIGNATURE__________________________________  ________________________________________________________________________   Rogelia Mire  An incentive spirometer is a tool that can help keep your lungs clear and active. This tool measures how well you are filling your lungs with each breath. Taking long deep breaths may help reverse or decrease the chance of developing breathing (pulmonary) problems (especially infection) following: A long period of time when you are unable to move or be active. BEFORE THE PROCEDURE  If the spirometer includes an indicator to show your best effort, your nurse or respiratory therapist will set it to a desired goal. If possible, sit up straight or lean slightly forward. Try not to slouch. Hold the incentive spirometer in an upright position. INSTRUCTIONS FOR USE  Sit on the edge of your bed if possible, or sit up as far as you can in bed or on a chair. Hold the incentive spirometer in an upright position. Breathe out normally. Place the mouthpiece in your mouth and seal your lips tightly around it. Breathe in slowly and as deeply as possible, raising the piston or the ball toward the top of the column. Hold your breath for 3-5 seconds or for as long as possible. Allow the piston or ball to fall to the bottom of the column. Remove the mouthpiece from your mouth and breathe out normally. Rest for a few seconds and repeat Steps 1 through 7 at least 10 times every 1-2 hours when you are awake. Take  your time and take a few normal breaths between deep breaths. The spirometer may include an indicator to show your best effort. Use the indicator as a goal to work toward during each repetition. After each set of 10 deep breaths, practice coughing to be sure your lungs are clear. If you have an incision (the cut made at the time of surgery), support your incision when coughing by placing a pillow or rolled up towels firmly against it. Once you are able to get out of bed, walk around indoors and cough well. You may stop using the incentive spirometer when instructed by your caregiver.  RISKS AND COMPLICATIONS Take your time so you do not get dizzy or light-headed. If you are in pain, you may need to take or ask for pain medication before doing incentive spirometry. It is harder to take a deep breath if you are having pain. AFTER USE Rest and breathe slowly and easily. It can be helpful to keep track of a log of your progress. Your caregiver can provide you with a simple table to help with this. If you are using the spirometer at home, follow these instructions: SEEK MEDICAL CARE IF:  You are having difficultly using the spirometer. You have trouble using the spirometer as often as instructed. Your pain medication is not giving enough relief while using the spirometer. You develop fever of 100.5 F (38.1 C) or higher. SEEK IMMEDIATE MEDICAL CARE IF:  You cough up bloody sputum that had not been present before. You develop fever of 102 F (38.9 C) or greater. You develop worsening pain at or near  the incision site. MAKE SURE YOU:  Understand these instructions. Will watch your condition. Will get help right away if you are not doing well or get worse. Document Released: 09/30/2006 Document Revised: 08/12/2011 Document Reviewed: 12/01/2006 [facility] Patient Information 2014 [place], [place].   ________________________________________________________________________

## 2021-12-10 ENCOUNTER — Other Ambulatory Visit: Payer: Self-pay

## 2021-12-10 ENCOUNTER — Encounter (HOSPITAL_COMMUNITY)
Admission: RE | Admit: 2021-12-10 | Discharge: 2021-12-10 | Disposition: A | Discharge status: Home / Self Care | Payer: Medicare Other | Source: Ambulatory Visit | Attending: Orthopedic Surgery | Admitting: Orthopedic Surgery

## 2021-12-10 ENCOUNTER — Encounter (HOSPITAL_COMMUNITY): Payer: Self-pay

## 2021-12-10 VITALS — BP 154/71 | HR 58 | Temp 98.1°F | Ht 65.5 in | Wt 242.0 lb

## 2021-12-10 DIAGNOSIS — E119 Type 2 diabetes mellitus without complications: Secondary | ICD-10-CM | POA: Insufficient documentation

## 2021-12-10 DIAGNOSIS — R030 Elevated blood-pressure reading, without diagnosis of hypertension: Secondary | ICD-10-CM | POA: Insufficient documentation

## 2021-12-10 DIAGNOSIS — Z01818 Encounter for other preprocedural examination: Secondary | ICD-10-CM | POA: Insufficient documentation

## 2021-12-10 HISTORY — DX: Anxiety disorder, unspecified: F41.9

## 2021-12-10 HISTORY — DX: Type 2 diabetes mellitus without complications: E11.9

## 2021-12-10 HISTORY — DX: Dyspnea, unspecified: R06.00

## 2021-12-10 LAB — CBC
HCT: 40.7 % (ref 39.0–52.0)
Hemoglobin: 13.3 g/dL (ref 13.0–17.0)
MCH: 29.7 pg (ref 26.0–34.0)
MCHC: 32.7 g/dL (ref 30.0–36.0)
MCV: 90.8 fL (ref 80.0–100.0)
Platelets: 229 10*3/uL (ref 150–400)
RBC: 4.48 MIL/uL (ref 4.22–5.81)
RDW: 13.5 % (ref 11.5–15.5)
WBC: 7.9 10*3/uL (ref 4.0–10.5)
nRBC: 0 % (ref 0.0–0.2)

## 2021-12-10 LAB — BASIC METABOLIC PANEL
Anion gap: 8 (ref 5–15)
BUN: 14 mg/dL (ref 8–23)
CO2: 26 mmol/L (ref 22–32)
Calcium: 9.3 mg/dL (ref 8.9–10.3)
Chloride: 105 mmol/L (ref 98–111)
Creatinine, Ser: 0.69 mg/dL (ref 0.61–1.24)
GFR, Estimated: >60 mL/min (ref >60)
Glucose, Bld: 122 mg/dL — ABNORMAL HIGH (ref 70–99)
Potassium: 4.1 mmol/L (ref 3.5–5.1)
Sodium: 139 mmol/L (ref 135–145)

## 2021-12-10 LAB — HEMOGLOBIN A1C
Hgb A1c MFr Bld: 6.1 % — ABNORMAL HIGH (ref 4.8–5.6)
Mean Plasma Glucose: 128.37 mg/dL

## 2021-12-10 LAB — SURGICAL PCR SCREEN
MRSA, PCR: NEGATIVE
Staphylococcus aureus: NEGATIVE

## 2021-12-10 LAB — GLUCOSE, CAPILLARY: Glucose-Capillary: 129 mg/dL — ABNORMAL HIGH (ref 70–99)

## 2021-12-10 NOTE — Progress Notes (Addendum)
For Short Stay: COVID SWAB appointment date: Date of COVID positive in last 90 days:  Bowel Prep reminder:   For Anesthesia: PCP - Skeet Simmer Gordnier: PA. LOV: 10/09/21 Clearance: Skeet Simmer Buckler:11/14/21: Chart Cardiologist -   Chest x-ray -  EKG -  Stress Test -  ECHO - 09/14/09 Cardiac Cath -  Pacemaker/ICD device last checked: Pacemaker orders received: Device Rep notified:  Spinal Cord Stimulator:  Sleep Study -  CPAP -   Fasting Blood Sugar -  Checks Blood Sugar _____ times a day Date and result of last Hgb A1c- 6.4: 10/09/21  Blood Thinner Instructions: Aspirin Instructions: Last Dose:  Activity level: Can go up a flight of stairs and activities of daily living without stopping and without chest pain and/or shortness of breath   Able to exercise without chest pain and/or shortness of breath   Unable to go up a flight of stairs without chest pain and/or shortness of breath     Anesthesia review: Hx: HTN,DIA,SOB,OSA,Seizure  Patient denies shortness of breath, fever, cough and chest pain at PAT appointment   Patient verbalized understanding of instructions that were given to them at the PAT appointment. Patient was also instructed that they will need to review over the PAT instructions again at home before surgery.

## 2021-12-11 ENCOUNTER — Ambulatory Visit: Payer: Self-pay | Admitting: Student
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
## 2021-12-18 ENCOUNTER — Encounter (HOSPITAL_COMMUNITY): Payer: Self-pay | Admitting: Orthopedic Surgery

## 2021-12-18 NOTE — Anesthesia Preprocedure Evaluation (Addendum)
Anesthesia Evaluation  Patient identified by MRN, date of birth, ID band Patient awake    Reviewed: Allergy & Precautions, NPO status , Patient's Chart, lab work & pertinent test results  Airway Mallampati: III  TM Distance: >3 FB Neck ROM: Full    Dental  (+) Teeth Intact, Dental Advisory Given   Pulmonary sleep apnea ,    breath sounds clear to auscultation       Cardiovascular hypertension, Pt. on medications  Rhythm:Regular Rate:Normal     Neuro/Psych Seizures -,  PSYCHIATRIC DISORDERS Anxiety Depression    GI/Hepatic Neg liver ROS, GERD  ,  Endo/Other  diabetesHypothyroidism   Renal/GU Renal disease     Musculoskeletal  (+) Arthritis ,   Abdominal Normal abdominal exam  (+)   Peds  Hematology   Anesthesia Other Findings   Reproductive/Obstetrics                            Anesthesia Physical Anesthesia Plan  ASA: 3  Anesthesia Plan: General   Post-op Pain Management: Regional block*   Induction: Intravenous  PONV Risk Score and Plan: 3 and Ondansetron, Midazolam and Dexamethasone  Airway Management Planned: Oral ETT  Additional Equipment: None  Intra-op Plan:   Post-operative Plan: Extubation in OR  Informed Consent: I have reviewed the patients History and Physical, chart, labs and discussed the procedure including the risks, benefits and alternatives for the proposed anesthesia with the patient or authorized representative who has indicated his/her understanding and acceptance.     Dental advisory given  Plan Discussed with: CRNA  Anesthesia Plan Comments: (Pt amenable to spinal but due to the probability of a lengthy case due to hardware removal, we proceeded with GA. )      Anesthesia Quick Evaluation

## 2021-12-19 ENCOUNTER — Other Ambulatory Visit: Payer: Self-pay

## 2021-12-19 ENCOUNTER — Ambulatory Visit (HOSPITAL_COMMUNITY): Payer: Medicare Other | Admitting: Anesthesiology

## 2021-12-19 ENCOUNTER — Ambulatory Visit (HOSPITAL_COMMUNITY): Payer: Medicare Other

## 2021-12-19 ENCOUNTER — Encounter (HOSPITAL_COMMUNITY)
Admission: RE | Disposition: A | Discharge status: Home / Self Care | Payer: Self-pay | Source: Ambulatory Visit | Attending: Orthopedic Surgery

## 2021-12-19 ENCOUNTER — Ambulatory Visit (HOSPITAL_COMMUNITY)
Admission: RE | Admit: 2021-12-19 | Discharge: 2021-12-21 | Disposition: A | Discharge status: Home / Self Care | Payer: Medicare Other | Source: Ambulatory Visit | Attending: Orthopedic Surgery | Admitting: Orthopedic Surgery

## 2021-12-19 ENCOUNTER — Ambulatory Visit (HOSPITAL_BASED_OUTPATIENT_CLINIC_OR_DEPARTMENT_OTHER): Payer: Medicare Other | Admitting: Anesthesiology

## 2021-12-19 ENCOUNTER — Encounter (HOSPITAL_COMMUNITY): Payer: Self-pay | Admitting: Orthopedic Surgery

## 2021-12-19 DIAGNOSIS — N182 Chronic kidney disease, stage 2 (mild): Secondary | ICD-10-CM | POA: Insufficient documentation

## 2021-12-19 DIAGNOSIS — D539 Nutritional anemia, unspecified: Secondary | ICD-10-CM

## 2021-12-19 DIAGNOSIS — T1490XS Injury, unspecified, sequela: Secondary | ICD-10-CM | POA: Diagnosis not present

## 2021-12-19 DIAGNOSIS — G4733 Obstructive sleep apnea (adult) (pediatric): Secondary | ICD-10-CM | POA: Diagnosis not present

## 2021-12-19 DIAGNOSIS — I10 Essential (primary) hypertension: Secondary | ICD-10-CM

## 2021-12-19 DIAGNOSIS — E119 Type 2 diabetes mellitus without complications: Secondary | ICD-10-CM

## 2021-12-19 DIAGNOSIS — Z79899 Other long term (current) drug therapy: Secondary | ICD-10-CM | POA: Insufficient documentation

## 2021-12-19 DIAGNOSIS — Z7984 Long term (current) use of oral hypoglycemic drugs: Secondary | ICD-10-CM

## 2021-12-19 DIAGNOSIS — X58XXXS Exposure to other specified factors, sequela: Secondary | ICD-10-CM | POA: Diagnosis not present

## 2021-12-19 DIAGNOSIS — R569 Unspecified convulsions: Secondary | ICD-10-CM | POA: Diagnosis not present

## 2021-12-19 DIAGNOSIS — M1732 Unilateral post-traumatic osteoarthritis, left knee: Secondary | ICD-10-CM

## 2021-12-19 DIAGNOSIS — F419 Anxiety disorder, unspecified: Secondary | ICD-10-CM | POA: Insufficient documentation

## 2021-12-19 DIAGNOSIS — M12562 Traumatic arthropathy, left knee: Secondary | ICD-10-CM | POA: Insufficient documentation

## 2021-12-19 DIAGNOSIS — I129 Hypertensive chronic kidney disease with stage 1 through stage 4 chronic kidney disease, or unspecified chronic kidney disease: Secondary | ICD-10-CM | POA: Diagnosis not present

## 2021-12-19 DIAGNOSIS — F418 Other specified anxiety disorders: Secondary | ICD-10-CM

## 2021-12-19 DIAGNOSIS — Z96652 Presence of left artificial knee joint: Secondary | ICD-10-CM

## 2021-12-19 HISTORY — PX: KNEE ARTHROPLASTY: SHX992

## 2021-12-19 LAB — GLUCOSE, CAPILLARY
Glucose-Capillary: 133 mg/dL — ABNORMAL HIGH (ref 70–99)
Glucose-Capillary: 187 mg/dL — ABNORMAL HIGH (ref 70–99)
Glucose-Capillary: 165 mg/dL — ABNORMAL HIGH (ref 70–99)
Glucose-Capillary: 153 mg/dL — ABNORMAL HIGH (ref 70–99)

## 2021-12-19 SURGERY — ARTHROPLASTY, KNEE, TOTAL, USING IMAGELESS COMPUTER-ASSISTED NAVIGATION
Anesthesia: General | Site: Knee | Laterality: Left

## 2021-12-19 MED ORDER — ROCURONIUM BROMIDE 10 MG/ML (PF) SYRINGE
PREFILLED_SYRINGE | INTRAVENOUS | Status: DC | PRN
  Administered 2021-12-19: 60 mg via INTRAVENOUS
  Administered 2021-12-19 (×2): 20 mg via INTRAVENOUS

## 2021-12-19 MED ORDER — LACTATED RINGERS IV SOLN: INTRAVENOUS | Status: DC

## 2021-12-19 MED ORDER — POVIDONE-IODINE 10 % EX SWAB: 2.0000 | Freq: Once | CUTANEOUS | Status: DC

## 2021-12-19 MED ORDER — ASPIRIN 81 MG PO CHEW
81.0000 mg | CHEWABLE_TABLET | Freq: Two times a day (BID) | ORAL | Status: DC
  Administered 2021-12-19 – 2021-12-21 (×4): 81 mg via ORAL
  Filled 2021-12-19 (×4): qty 1

## 2021-12-19 MED ORDER — SODIUM CHLORIDE (PF) 0.9 % IJ SOLN
INTRAMUSCULAR | Status: DC | PRN
  Administered 2021-12-19: 50 mL

## 2021-12-19 MED ORDER — HYDROMORPHONE HCL 1 MG/ML IJ SOLN
0.2500 mg | INTRAMUSCULAR | Status: DC | PRN
  Administered 2021-12-19 (×2): 0.5 mg via INTRAVENOUS

## 2021-12-19 MED ORDER — EPHEDRINE SULFATE-NACL 50-0.9 MG/10ML-% IV SOSY
PREFILLED_SYRINGE | INTRAVENOUS | Status: DC | PRN
  Administered 2021-12-19 (×2): 5 mg via INTRAVENOUS

## 2021-12-19 MED ORDER — PROPOFOL 1000 MG/100ML IV EMUL
INTRAVENOUS | Status: AC
  Filled 2021-12-19: qty 100

## 2021-12-19 MED ORDER — PROPOFOL 10 MG/ML IV BOLUS
INTRAVENOUS | Status: AC
  Filled 2021-12-19: qty 20

## 2021-12-19 MED ORDER — FENTANYL CITRATE (PF) 100 MCG/2ML IJ SOLN
INTRAMUSCULAR | Status: AC
  Filled 2021-12-19: qty 2

## 2021-12-19 MED ORDER — STERILE WATER FOR IRRIGATION IR SOLN
Status: DC | PRN
  Administered 2021-12-19: 2000 mL

## 2021-12-19 MED ORDER — HYDROMORPHONE HCL 1 MG/ML IJ SOLN
INTRAMUSCULAR | Status: AC
  Filled 2021-12-19: qty 1

## 2021-12-19 MED ORDER — MORPHINE SULFATE (PF) 2 MG/ML IV SOLN
0.5000 mg | INTRAVENOUS | Status: DC | PRN
  Administered 2021-12-19: 1 mg via INTRAVENOUS
  Filled 2021-12-19: qty 1

## 2021-12-19 MED ORDER — DIPHENHYDRAMINE HCL 12.5 MG/5ML PO ELIX
12.5000 mg | ORAL_SOLUTION | ORAL | Status: DC | PRN
  Administered 2021-12-20: 25 mg via ORAL
  Filled 2021-12-19: qty 10

## 2021-12-19 MED ORDER — HYDROCODONE-ACETAMINOPHEN 5-325 MG PO TABS
1.0000 | ORAL_TABLET | ORAL | Status: DC | PRN
  Administered 2021-12-19: 2 via ORAL
  Administered 2021-12-21: 1 via ORAL
  Filled 2021-12-19: qty 2
  Filled 2021-12-19: qty 1

## 2021-12-19 MED ORDER — DEXMEDETOMIDINE (PRECEDEX) IN NS 20 MCG/5ML (4 MCG/ML) IV SYRINGE
PREFILLED_SYRINGE | INTRAVENOUS | Status: DC | PRN
  Administered 2021-12-19: 8 ug via INTRAVENOUS
  Administered 2021-12-19: 4 ug via INTRAVENOUS
  Administered 2021-12-19: 8 ug via INTRAVENOUS

## 2021-12-19 MED ORDER — DEXAMETHASONE SODIUM PHOSPHATE 10 MG/ML IJ SOLN
INTRAMUSCULAR | Status: AC
Start: 2021-12-19 — End: ?
  Filled 2021-12-19: qty 1

## 2021-12-19 MED ORDER — BUPIVACAINE-EPINEPHRINE 0.25% -1:200000 IJ SOLN
INTRAMUSCULAR | Status: DC | PRN
  Administered 2021-12-19: 30 mL

## 2021-12-19 MED ORDER — KETOROLAC TROMETHAMINE 30 MG/ML IJ SOLN
INTRAMUSCULAR | Status: AC
  Filled 2021-12-19: qty 1

## 2021-12-19 MED ORDER — DEXAMETHASONE SODIUM PHOSPHATE 10 MG/ML IJ SOLN
INTRAMUSCULAR | Status: DC | PRN
  Administered 2021-12-19: 5 mg via INTRAVENOUS

## 2021-12-19 MED ORDER — ALUM & MAG HYDROXIDE-SIMETH 200-200-20 MG/5ML PO SUSP: 30.0000 mL | ORAL | Status: DC | PRN

## 2021-12-19 MED ORDER — ROPIVACAINE HCL 5 MG/ML IJ SOLN
INTRAMUSCULAR | Status: DC | PRN
  Administered 2021-12-19: 30 mL via PERINEURAL

## 2021-12-19 MED ORDER — VITAMIN D 25 MCG (1000 UNIT) PO TABS
2000.0000 [IU] | ORAL_TABLET | ORAL | Status: DC
  Administered 2021-12-19 – 2021-12-21 (×2): 2000 [IU] via ORAL
  Filled 2021-12-19 (×2): qty 2

## 2021-12-19 MED ORDER — GABAPENTIN 400 MG PO CAPS
400.0000 mg | ORAL_CAPSULE | Freq: Every day | ORAL | Status: DC
  Administered 2021-12-19 – 2021-12-20 (×2): 400 mg via ORAL
  Filled 2021-12-19 (×2): qty 1

## 2021-12-19 MED ORDER — CHLORHEXIDINE GLUCONATE 0.12 % MT SOLN
15.0000 mL | Freq: Once | OROMUCOSAL | Status: AC
  Administered 2021-12-19: 15 mL via OROMUCOSAL

## 2021-12-19 MED ORDER — ORAL CARE MOUTH RINSE: 15.0000 mL | Freq: Once | OROMUCOSAL | Status: AC

## 2021-12-19 MED ORDER — SODIUM CHLORIDE 0.9 % IV SOLN: INTRAVENOUS | Status: DC

## 2021-12-19 MED ORDER — TRANEXAMIC ACID-NACL 1000-0.7 MG/100ML-% IV SOLN
1000.0000 mg | INTRAVENOUS | Status: AC
  Administered 2021-12-19: 1000 mg via INTRAVENOUS
  Filled 2021-12-19: qty 100

## 2021-12-19 MED ORDER — FENTANYL CITRATE (PF) 100 MCG/2ML IJ SOLN
INTRAMUSCULAR | Status: DC | PRN
Start: 2021-12-19 — End: 2021-12-19
  Administered 2021-12-19 (×3): 50 ug via INTRAVENOUS
  Administered 2021-12-19 (×3): 25 ug via INTRAVENOUS
  Administered 2021-12-19: 50 ug via INTRAVENOUS
  Administered 2021-12-19: 25 ug via INTRAVENOUS

## 2021-12-19 MED ORDER — MENTHOL 3 MG MT LOZG: 1.0000 | LOZENGE | OROMUCOSAL | Status: DC | PRN

## 2021-12-19 MED ORDER — PROMETHAZINE HCL 25 MG/ML IJ SOLN: 6.2500 mg | INTRAMUSCULAR | Status: DC | PRN

## 2021-12-19 MED ORDER — ACETAMINOPHEN 10 MG/ML IV SOLN
1000.0000 mg | Freq: Once | INTRAVENOUS | Status: DC | PRN
  Administered 2021-12-19: 1000 mg via INTRAVENOUS

## 2021-12-19 MED ORDER — BISMUTH SUBSALICYLATE 262 MG/15ML PO SUSP: 30.0000 mL | Freq: Four times a day (QID) | ORAL | Status: DC | PRN

## 2021-12-19 MED ORDER — METHOCARBAMOL 500 MG IVPB - SIMPLE MED
500.0000 mg | Freq: Four times a day (QID) | INTRAVENOUS | Status: DC | PRN
  Administered 2021-12-19: 500 mg via INTRAVENOUS

## 2021-12-19 MED ORDER — ONDANSETRON HCL 4 MG/2ML IJ SOLN
INTRAMUSCULAR | Status: AC
  Filled 2021-12-19: qty 2

## 2021-12-19 MED ORDER — POVIDONE-IODINE 10 % EX SWAB
2.0000 | Freq: Once | CUTANEOUS | Status: AC
  Administered 2021-12-19: 2 via TOPICAL

## 2021-12-19 MED ORDER — CELECOXIB 200 MG PO CAPS
200.0000 mg | ORAL_CAPSULE | Freq: Two times a day (BID) | ORAL | Status: DC
  Administered 2021-12-19 – 2021-12-21 (×4): 200 mg via ORAL
  Filled 2021-12-19 (×4): qty 1

## 2021-12-19 MED ORDER — CEFAZOLIN SODIUM-DEXTROSE 2-4 GM/100ML-% IV SOLN
2.0000 g | INTRAVENOUS | Status: AC
  Administered 2021-12-19: 2 g via INTRAVENOUS
  Filled 2021-12-19: qty 100

## 2021-12-19 MED ORDER — KETOROLAC TROMETHAMINE 30 MG/ML IJ SOLN
INTRAMUSCULAR | Status: DC | PRN
  Administered 2021-12-19: 30 mg

## 2021-12-19 MED ORDER — ATORVASTATIN CALCIUM 10 MG PO TABS
10.0000 mg | ORAL_TABLET | Freq: Every day | ORAL | Status: DC
Start: 2021-12-19 — End: 2021-12-21
  Administered 2021-12-19 – 2021-12-20 (×2): 10 mg via ORAL
  Filled 2021-12-19 (×2): qty 1

## 2021-12-19 MED ORDER — SODIUM CHLORIDE (PF) 0.9 % IJ SOLN
INTRAMUSCULAR | Status: AC
  Filled 2021-12-19: qty 30

## 2021-12-19 MED ORDER — ACETAMINOPHEN 500 MG PO TABS
1000.0000 mg | ORAL_TABLET | Freq: Once | ORAL | Status: AC
  Administered 2021-12-19: 1000 mg via ORAL
  Filled 2021-12-19: qty 2

## 2021-12-19 MED ORDER — ONDANSETRON HCL 4 MG/2ML IJ SOLN
INTRAMUSCULAR | Status: DC | PRN
  Administered 2021-12-19: 4 mg via INTRAVENOUS

## 2021-12-19 MED ORDER — DEXTROMETHORPHAN POLISTIREX ER 30 MG/5ML PO SUER
30.0000 mg | ORAL | Status: DC | PRN
Start: 2021-12-19 — End: 2021-12-21

## 2021-12-19 MED ORDER — HYDROCODONE-ACETAMINOPHEN 7.5-325 MG PO TABS
1.0000 | ORAL_TABLET | ORAL | Status: DC | PRN
  Administered 2021-12-20 (×2): 2 via ORAL
  Filled 2021-12-19 (×3): qty 2

## 2021-12-19 MED ORDER — SENNA 8.6 MG PO TABS
1.0000 | ORAL_TABLET | Freq: Two times a day (BID) | ORAL | Status: DC
  Administered 2021-12-20 – 2021-12-21 (×2): 8.6 mg via ORAL
  Filled 2021-12-19 (×4): qty 1

## 2021-12-19 MED ORDER — METOCLOPRAMIDE HCL 5 MG/ML IJ SOLN: 5.0000 mg | Freq: Three times a day (TID) | INTRAMUSCULAR | Status: DC | PRN

## 2021-12-19 MED ORDER — INSULIN ASPART 100 UNIT/ML IJ SOLN: 0.0000 [IU] | Freq: Every day | INTRAMUSCULAR | Status: DC

## 2021-12-19 MED ORDER — SUGAMMADEX SODIUM 500 MG/5ML IV SOLN
INTRAVENOUS | Status: AC
Start: 2021-12-19 — End: ?
  Filled 2021-12-19: qty 5

## 2021-12-19 MED ORDER — INSULIN ASPART 100 UNIT/ML IJ SOLN
0.0000 [IU] | Freq: Three times a day (TID) | INTRAMUSCULAR | Status: DC
  Administered 2021-12-19: 2 [IU] via SUBCUTANEOUS
  Administered 2021-12-20 – 2021-12-21 (×4): 1 [IU] via SUBCUTANEOUS

## 2021-12-19 MED ORDER — LIDOCAINE HCL (PF) 2 % IJ SOLN
INTRAMUSCULAR | Status: AC
Start: 2021-12-19 — End: ?
  Filled 2021-12-19: qty 5

## 2021-12-19 MED ORDER — PROPOFOL 10 MG/ML IV BOLUS
INTRAVENOUS | Status: DC | PRN
  Administered 2021-12-19: 150 mg via INTRAVENOUS

## 2021-12-19 MED ORDER — ACETAMINOPHEN 325 MG PO TABS: 325.0000 mg | ORAL_TABLET | Freq: Once | ORAL | Status: DC | PRN

## 2021-12-19 MED ORDER — EPHEDRINE 5 MG/ML INJ
INTRAVENOUS | Status: AC
  Filled 2021-12-19: qty 5

## 2021-12-19 MED ORDER — METHOCARBAMOL 500 MG PO TABS
500.0000 mg | ORAL_TABLET | Freq: Four times a day (QID) | ORAL | Status: DC | PRN
  Administered 2021-12-19 – 2021-12-21 (×5): 500 mg via ORAL
  Filled 2021-12-19 (×5): qty 1

## 2021-12-19 MED ORDER — ISOPROPYL ALCOHOL 70 % SOLN
Status: DC | PRN
  Administered 2021-12-19: 1 via TOPICAL

## 2021-12-19 MED ORDER — MEPERIDINE HCL 50 MG/ML IJ SOLN: 6.2500 mg | INTRAMUSCULAR | Status: DC | PRN

## 2021-12-19 MED ORDER — AMISULPRIDE (ANTIEMETIC) 5 MG/2ML IV SOLN
10.0000 mg | Freq: Once | INTRAVENOUS | Status: DC | PRN
Start: 2021-12-19 — End: 2021-12-19

## 2021-12-19 MED ORDER — LIDOCAINE HCL (PF) 2 % IJ SOLN
INTRAMUSCULAR | Status: AC
  Filled 2021-12-19: qty 5

## 2021-12-19 MED ORDER — DEXAMETHASONE SODIUM PHOSPHATE 10 MG/ML IJ SOLN
INTRAMUSCULAR | Status: AC
  Filled 2021-12-19: qty 1

## 2021-12-19 MED ORDER — PHENOL 1.4 % MT LIQD
1.0000 | OROMUCOSAL | Status: DC | PRN
Start: 2021-12-19 — End: 2021-12-21

## 2021-12-19 MED ORDER — SODIUM CHLORIDE 0.9% IV SOLUTION
INTRAVENOUS | Status: AC | PRN
  Administered 2021-12-19: 1000 mL

## 2021-12-19 MED ORDER — ACETAMINOPHEN 10 MG/ML IV SOLN
INTRAVENOUS | Status: AC
  Filled 2021-12-19: qty 100

## 2021-12-19 MED ORDER — VITAMIN B-12 1000 MCG PO TABS
3000.0000 ug | ORAL_TABLET | ORAL | Status: DC
  Administered 2021-12-20: 3000 ug via ORAL
  Filled 2021-12-19: qty 3

## 2021-12-19 MED ORDER — DOCUSATE SODIUM 100 MG PO CAPS
100.0000 mg | ORAL_CAPSULE | Freq: Two times a day (BID) | ORAL | Status: DC
  Administered 2021-12-20 – 2021-12-21 (×2): 100 mg via ORAL
  Filled 2021-12-19 (×3): qty 1

## 2021-12-19 MED ORDER — OXCARBAZEPINE 300 MG PO TABS
600.0000 mg | ORAL_TABLET | Freq: Two times a day (BID) | ORAL | Status: DC
Start: 2021-12-19 — End: 2021-12-21
  Administered 2021-12-19 – 2021-12-21 (×5): 600 mg via ORAL
  Filled 2021-12-19 (×4): qty 2

## 2021-12-19 MED ORDER — BUPIVACAINE-EPINEPHRINE (PF) 0.25% -1:200000 IJ SOLN
INTRAMUSCULAR | Status: AC
  Filled 2021-12-19: qty 30

## 2021-12-19 MED ORDER — SUGAMMADEX SODIUM 500 MG/5ML IV SOLN
INTRAVENOUS | Status: DC | PRN
  Administered 2021-12-19: 250 mg via INTRAVENOUS

## 2021-12-19 MED ORDER — LEVOTHYROXINE SODIUM 88 MCG PO TABS
88.0000 ug | ORAL_TABLET | Freq: Every day | ORAL | Status: DC
  Administered 2021-12-20 – 2021-12-21 (×2): 88 ug via ORAL
  Filled 2021-12-19 (×2): qty 1

## 2021-12-19 MED ORDER — ACETAMINOPHEN 325 MG PO TABS
325.0000 mg | ORAL_TABLET | Freq: Four times a day (QID) | ORAL | Status: DC | PRN
  Administered 2021-12-20 – 2021-12-21 (×3): 650 mg via ORAL
  Filled 2021-12-19 (×3): qty 2

## 2021-12-19 MED ORDER — ONDANSETRON HCL 4 MG/2ML IJ SOLN: 4.0000 mg | Freq: Four times a day (QID) | INTRAMUSCULAR | Status: DC | PRN

## 2021-12-19 MED ORDER — ONDANSETRON HCL 4 MG PO TABS
4.0000 mg | ORAL_TABLET | Freq: Four times a day (QID) | ORAL | Status: DC | PRN
  Administered 2021-12-20: 4 mg via ORAL
  Filled 2021-12-19: qty 1

## 2021-12-19 MED ORDER — MIDAZOLAM HCL 2 MG/2ML IJ SOLN
INTRAMUSCULAR | Status: AC
Start: 2021-12-19 — End: ?
  Filled 2021-12-19: qty 2

## 2021-12-19 MED ORDER — MIDAZOLAM HCL 5 MG/5ML IJ SOLN
INTRAMUSCULAR | Status: DC | PRN
  Administered 2021-12-19 (×2): 1 mg via INTRAVENOUS

## 2021-12-19 MED ORDER — POLYETHYLENE GLYCOL 3350 17 G PO PACK: 17.0000 g | PACK | Freq: Every day | ORAL | Status: DC | PRN

## 2021-12-19 MED ORDER — LORATADINE 10 MG PO TABS: 10.0000 mg | ORAL_TABLET | Freq: Every day | ORAL | Status: DC | PRN

## 2021-12-19 MED ORDER — SODIUM CHLORIDE 0.9 % IR SOLN
Status: DC | PRN
  Administered 2021-12-19 (×2): 1000 mL

## 2021-12-19 MED ORDER — CEFAZOLIN SODIUM-DEXTROSE 2-4 GM/100ML-% IV SOLN
2.0000 g | Freq: Four times a day (QID) | INTRAVENOUS | Status: AC
  Administered 2021-12-19 (×2): 2 g via INTRAVENOUS
  Filled 2021-12-19 (×2): qty 100

## 2021-12-19 MED ORDER — LIDOCAINE 2% (20 MG/ML) 5 ML SYRINGE
INTRAMUSCULAR | Status: DC | PRN
  Administered 2021-12-19: 80 mg via INTRAVENOUS

## 2021-12-19 MED ORDER — ACETAMINOPHEN 160 MG/5ML PO SOLN: 325.0000 mg | Freq: Once | ORAL | Status: DC | PRN

## 2021-12-19 MED ORDER — METHOCARBAMOL 500 MG IVPB - SIMPLE MED
INTRAVENOUS | Status: AC
  Filled 2021-12-19: qty 55

## 2021-12-19 MED ORDER — SODIUM CHLORIDE (PF) 0.9 % IJ SOLN
INTRAMUSCULAR | Status: AC
  Filled 2021-12-19: qty 50

## 2021-12-19 MED ORDER — PROPOFOL 500 MG/50ML IV EMUL
INTRAVENOUS | Status: DC | PRN
  Administered 2021-12-19: 25 ug/kg/min via INTRAVENOUS

## 2021-12-19 MED ORDER — DEXMEDETOMIDINE HCL IN NACL 80 MCG/20ML IV SOLN
INTRAVENOUS | Status: AC
  Filled 2021-12-19: qty 20

## 2021-12-19 MED ORDER — ISOPROPYL ALCOHOL 70 % SOLN
Status: AC
  Filled 2021-12-19: qty 480

## 2021-12-19 MED ORDER — METOCLOPRAMIDE HCL 5 MG PO TABS
5.0000 mg | ORAL_TABLET | Freq: Three times a day (TID) | ORAL | Status: DC | PRN
  Administered 2021-12-20: 10 mg via ORAL
  Filled 2021-12-19: qty 2

## 2021-12-19 SURGICAL SUPPLY — 72 items
BAG COUNTER SPONGE SURGICOUNT (BAG) ×1 IMPLANT
BAG ZIPLOCK 12X15 (MISCELLANEOUS) ×1 IMPLANT
BATTERY INSTRU NAVIGATION (MISCELLANEOUS) ×6 IMPLANT
BLADE SAW RECIPROCATING 77.5 (BLADE) ×2 IMPLANT
BNDG ELASTIC 4X5.8 VLCR STR LF (GAUZE/BANDAGES/DRESSINGS) ×2 IMPLANT
BNDG ELASTIC 6X5.8 VLCR STR LF (GAUZE/BANDAGES/DRESSINGS) ×2 IMPLANT
CHLORAPREP W/TINT 26 (MISCELLANEOUS) ×4 IMPLANT
COMP FEM PS STD 11 LT (Joint) ×2 IMPLANT
COMP TIB PS G 0D LT (Joint) ×2 IMPLANT
COMPONENT FEM PS STD 11 LT (Joint) IMPLANT
COMPONET TIB PS G 0D LT (Joint) IMPLANT
COVER SURGICAL LIGHT HANDLE (MISCELLANEOUS) ×2 IMPLANT
DERMABOND ADVANCED (GAUZE/BANDAGES/DRESSINGS) ×2
DERMABOND ADVANCED .7 DNX12 (GAUZE/BANDAGES/DRESSINGS) ×2 IMPLANT
DRAPE INCISE IOBAN 66X45 STRL (DRAPES) ×2 IMPLANT
DRAPE SHEET LG 3/4 BI-LAMINATE (DRAPES) ×6 IMPLANT
DRAPE U-SHAPE 47X51 STRL (DRAPES) ×2 IMPLANT
DRSG AQUACEL AG ADV 3.5X10 (GAUZE/BANDAGES/DRESSINGS) ×1 IMPLANT
DRSG AQUACEL AG ADV 3.5X14 (GAUZE/BANDAGES/DRESSINGS) ×2 IMPLANT
ELECT BLADE TIP CTD 4 INCH (ELECTRODE) ×2 IMPLANT
ELECT REM PT RETURN 15FT ADLT (MISCELLANEOUS) ×2 IMPLANT
GAUZE SPONGE 4X4 12PLY STRL (GAUZE/BANDAGES/DRESSINGS) ×2 IMPLANT
GLOVE BIO SURGEON STRL SZ 6.5 (GLOVE) ×2 IMPLANT
GLOVE BIO SURGEON STRL SZ8.5 (GLOVE) ×4 IMPLANT
GLOVE BIOGEL M 7.0 STRL (GLOVE) ×4 IMPLANT
GLOVE BIOGEL PI IND STRL 6.5 (GLOVE) IMPLANT
GLOVE BIOGEL PI IND STRL 7.5 (GLOVE) ×1 IMPLANT
GLOVE BIOGEL PI IND STRL 8.5 (GLOVE) ×1 IMPLANT
GLOVE BIOGEL PI INDICATOR 6.5 (GLOVE) ×2
GLOVE BIOGEL PI INDICATOR 7.5 (GLOVE) ×1
GLOVE BIOGEL PI INDICATOR 8.5 (GLOVE) ×3
GLOVE SURG LX 7.5 STRW (GLOVE) ×3
GLOVE SURG LX STRL 7.5 STRW (GLOVE) ×3 IMPLANT
GOWN SPEC L3 XXLG W/TWL (GOWN DISPOSABLE) ×5 IMPLANT
HANDPIECE INTERPULSE COAX TIP (DISPOSABLE) ×1
HDLS TROCR DRIL PIN KNEE 75 (PIN) ×2
HOLDER FOLEY CATH W/STRAP (MISCELLANEOUS) ×1 IMPLANT
HOOD PEEL AWAY FLYTE STAYCOOL (MISCELLANEOUS) ×6 IMPLANT
INSERT ARTISURF S8-11 18X22X14 (Insert) ×1 IMPLANT
KIT TURNOVER KIT A (KITS) IMPLANT
MARKER SKIN DUAL TIP RULER LAB (MISCELLANEOUS) ×2 IMPLANT
NDL SAFETY ECLIPSE 18X1.5 (NEEDLE) ×1 IMPLANT
NDL SPNL 18GX3.5 QUINCKE PK (NEEDLE) ×1 IMPLANT
NEEDLE HYPO 18GX1.5 SHARP (NEEDLE) ×1
NEEDLE SPNL 18GX3.5 QUINCKE PK (NEEDLE) ×2 IMPLANT
NS IRRIG 1000ML POUR BTL (IV SOLUTION) ×2 IMPLANT
PACK TOTAL KNEE CUSTOM (KITS) ×2 IMPLANT
PADDING CAST COTTON 6X4 STRL (CAST SUPPLIES) ×2 IMPLANT
PATELLA STD SZ 38X10 (Miscellaneous) ×1 IMPLANT
PIN DRILL HDLS TROCAR 75 4PK (PIN) IMPLANT
PROTECTOR NERVE ULNAR (MISCELLANEOUS) ×2 IMPLANT
SAW OSC TIP CART 19.5X105X1.3 (SAW) ×2 IMPLANT
SCREW FEMALE HEX FIX 25X2.5 (ORTHOPEDIC DISPOSABLE SUPPLIES) ×1 IMPLANT
SEALER BIPOLAR AQUA 6.0 (INSTRUMENTS) ×2 IMPLANT
SET HNDPC FAN SPRY TIP SCT (DISPOSABLE) ×1 IMPLANT
SET PAD KNEE POSITIONER (MISCELLANEOUS) ×2 IMPLANT
SOLUTION PRONTOSAN WOUND 350ML (IRRIGATION / IRRIGATOR) ×1 IMPLANT
SPIKE FLUID TRANSFER (MISCELLANEOUS) ×4 IMPLANT
SUT MNCRL AB 3-0 PS2 18 (SUTURE) ×2 IMPLANT
SUT MNCRL AB 4-0 PS2 18 (SUTURE) ×2 IMPLANT
SUT MON AB 2-0 CT1 36 (SUTURE) ×3 IMPLANT
SUT STRATAFIX PDO 1 14 VIOLET (SUTURE) ×1
SUT STRATFX PDO 1 14 VIOLET (SUTURE) ×1
SUT VIC AB 1 CTX 36 (SUTURE) ×2
SUT VIC AB 1 CTX36XBRD ANBCTR (SUTURE) ×2 IMPLANT
SUT VIC AB 2-0 CT1 27 (SUTURE) ×1
SUT VIC AB 2-0 CT1 TAPERPNT 27 (SUTURE) ×1 IMPLANT
SUTURE STRATFX PDO 1 14 VIOLET (SUTURE) ×1 IMPLANT
TRAY FOLEY MTR SLVR 16FR STAT (SET/KITS/TRAYS/PACK) IMPLANT
TUBE SUCTION HIGH CAP CLEAR NV (SUCTIONS) ×2 IMPLANT
WATER STERILE IRR 1000ML POUR (IV SOLUTION) ×4 IMPLANT
WRAP KNEE MAXI GEL POST OP (GAUZE/BANDAGES/DRESSINGS) ×1 IMPLANT

## 2021-12-19 NOTE — Anesthesia Procedure Notes (Signed)
Anesthesia Regional Block: Adductor canal block   Pre-Anesthetic Checklist: , timeout performed,  Correct Patient, Correct Site, Correct Laterality,  Correct Procedure, Correct Position, site marked,  Risks and benefits discussed,  Surgical consent,  Pre-op evaluation,  At surgeon's request and post-op pain management  Laterality: Left  Prep: chloraprep       Needles:  Injection technique: Single-shot  Needle Type: Echogenic Stimulator Needle     Needle Length: 9cm  Needle Gauge: 21     Additional Needles:   Procedures:,,,, ultrasound used (permanent image in chart),,    Narrative:  Start time: 12/19/2021 7:55 AM End time: 12/19/2021 8:00 AM Injection made incrementally with aspirations every 5 mL.  Performed by: Personally  Anesthesiologist: Shelton Silvas, MD  Additional Notes: Patient tolerated the procedure well. Local anesthetic introduced in an incremental fashion under minimal resistance after negative aspirations. No paresthesias were elicited. After completion of the procedure, no acute issues were identified and patient continued to be monitored by RN.

## 2021-12-19 NOTE — Anesthesia Postprocedure Evaluation (Signed)
Anesthesia Post Note  Patient: Isaac Lozano.  Procedure(s) Performed: COMPUTER ASSISTED TOTAL KNEE ARTHROPLASTY (Left: Knee)     Patient location during evaluation: PACU Anesthesia Type: General Level of consciousness: awake and alert Pain management: pain level controlled Vital Signs Assessment: post-procedure vital signs reviewed and stable Respiratory status: spontaneous breathing, nonlabored ventilation and respiratory function stable Cardiovascular status: blood pressure returned to baseline and stable Postop Assessment: no apparent nausea or vomiting Anesthetic complications: no   No notable events documented.  Last Vitals:  Vitals:   12/19/21 1345 12/19/21 1403  BP: (!) 128/59 126/65  Pulse: 81 85  Resp: 12 16  Temp:  36.9 C  SpO2: 94% 98%    Last Pain:  Vitals:   12/19/21 1403  TempSrc: Oral  PainSc:                  Lucretia Kern
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
## 2021-12-19 NOTE — Plan of Care (Signed)
  Problem: Education: Goal: Ability to describe self-care measures that may prevent or decrease complications (Diabetes Survival Skills Education) will improve Outcome: Progressing   Problem: Skin Integrity: Goal: Risk for impaired skin integrity will decrease Outcome: Progressing   Problem: Activity: Goal: Risk for activity intolerance will decrease Outcome: Progressing   Problem: Pain Managment: Goal: General experience of comfort will improve Outcome: Progressing   

## 2021-12-19 NOTE — Discharge Instructions (Signed)
 Dr. Brian Swinteck Total Joint Specialist [facility] [address] [place], [place] [address] [phone]  TOTAL KNEE REPLACEMENT POSTOPERATIVE DIRECTIONS    Knee Rehabilitation, Guidelines Following Surgery  Results after knee surgery are often greatly improved when you follow the exercise, range of motion and muscle strengthening exercises prescribed by your doctor. Safety measures are also important to protect the knee from further injury. Any time any of these exercises cause you to have increased pain or swelling in your knee joint, decrease the amount until you are comfortable again and slowly increase them. If you have problems or questions, call your caregiver or physical therapist for advice.   WEIGHT BEARING Weight bearing as tolerated with assist device (walker, cane, etc) as directed, use it as long as suggested by your surgeon or therapist, typically at least 4-6 weeks.  HOME CARE INSTRUCTIONS  Remove items at home which could result in a fall. This includes throw rugs or furniture in walking pathways.  Continue medications as instructed at time of discharge. You may have some home medications which will be placed on hold until you complete the course of blood thinner medication.  You may start showering once you are discharged home but do not submerge the incision under water. Just pat the incision dry and apply a dry gauze dressing on daily. Walk with walker as instructed.  You may resume a sexual relationship in one month or when given the OK by your doctor.  Use walker as long as suggested by your caregivers. Avoid periods of inactivity such as sitting longer than an hour when not asleep. This helps prevent blood clots.  You may put full weight on your legs and walk as much as is comfortable.  You may return to work once you are cleared by your doctor.  Do not drive a car for 6 weeks or until released by you surgeon.  Do not drive while  taking narcotics.  Wear the elastic stockings for three weeks following surgery during the day but you may remove then at night. Make sure you keep all of your appointments after your operation with all of your doctors and caregivers. You should call the office at the above phone number and make an appointment for approximately two weeks after the date of your surgery. Do not remove your surgical dressing. The dressing is waterproof; you may take showers in 3 days, but do not take tub baths or submerge the dressing. Please pick up a stool softener and laxative for home use as long as you are requiring pain medications. ICE to the affected knee every three hours for 30 minutes at a time and then as needed for pain and swelling.  Continue to use ice on the knee for pain and swelling from surgery. You may notice swelling that will progress down to the foot and ankle.  This is normal after surgery.  Elevate the leg when you are not up walking on it.   It is important for you to complete the blood thinner medication as prescribed by your doctor. Continue to use the breathing machine which will help keep your temperature down.  It is common for your temperature to cycle up and down following surgery, especially at night when you are not up moving around and exerting yourself.  The breathing machine keeps your lungs expanded and your temperature down.  RANGE OF MOTION AND STRENGTHENING EXERCISES  Rehabilitation of the knee is important following a knee injury or an   operation. After just a few days of immobilization, the muscles of the thigh which control the knee become weakened and shrink (atrophy). Knee exercises are designed to build up the tone and strength of the thigh muscles and to improve knee motion. Often times heat used for twenty to thirty minutes before working out will loosen up your tissues and help with improving the range of motion but do not use heat for the first two weeks following surgery.  These exercises can be done on a training (exercise) mat, on the floor, on a table or on a bed. Use what ever works the best and is most comfortable for you Knee exercises include:  Leg Lifts - While your knee is still immobilized in a splint or cast, you can do straight leg raises. Lift the leg to 60 degrees, hold for 3 sec, and slowly lower the leg. Repeat 10-20 times 2-3 times daily. Perform this exercise against resistance later as your knee gets better.  Quad and Hamstring Sets - Tighten up the muscle on the front of the thigh (Quad) and hold for 5-10 sec. Repeat this 10-20 times hourly. Hamstring sets are done by pushing the foot backward against an object and holding for 5-10 sec. Repeat as with quad sets.  A rehabilitation program following serious knee injuries can speed recovery and prevent re-injury in the future due to weakened muscles. Contact your doctor or a physical therapist for more information on knee rehabilitation.   POST-OPERATIVE OPIOID TAPER INSTRUCTIONS: It is important to wean off of your opioid medication as soon as possible. If you do not need pain medication after your surgery it is ok to stop day one. Opioids include: Codeine, Hydrocodone(Norco, Vicodin), Oxycodone(Percocet, oxycontin) and hydromorphone amongst others.  Long term and even short term use of opiods can cause: Increased pain response Dependence Constipation Depression Respiratory depression And more.  Withdrawal symptoms can include Flu like symptoms Nausea, vomiting And more Techniques to manage these symptoms Hydrate well Eat regular healthy meals Stay active Use relaxation techniques(deep breathing, meditating, yoga) Do Not substitute Alcohol to help with tapering If you have been on opioids for less than two weeks and do not have pain than it is ok to stop all together.  Plan to wean off of opioids This plan should start within one week post op of your joint replacement. Maintain the same  interval or time between taking each dose and first decrease the dose.  Cut the total daily intake of opioids by one tablet each day Next start to increase the time between doses. The last dose that should be eliminated is the evening dose.    SKILLED REHAB INSTRUCTIONS: If the patient is transferred to a skilled rehab facility following release from the hospital, a list of the current medications will be sent to the facility for the patient to continue.  When discharged from the skilled rehab facility, please have the facility set up the patient's Home Health Physical Therapy prior to being released. Also, the skilled facility will be responsible for providing the patient with their medications at time of release from the facility to include their pain medication, the muscle relaxants, and their blood thinner medication. If the patient is still at the rehab facility at time of the two week follow up appointment, the skilled rehab facility will also need to assist the patient in arranging follow up appointment in our office and any transportation needs.  MAKE SURE YOU:  Understand these instructions.  Will watch   your condition.  Will get help right away if you are not doing well or get worse.    Pick up stool softner and laxative for home use following surgery while on pain medications. Do NOT remove your dressing. You may shower.  Do not take tub baths or submerge incision under water. May shower starting three days after surgery. Please use a clean towel to pat the incision dry following showers. Continue to use ice for pain and swelling after surgery. Do not use any lotions or creams on the incision until instructed by your surgeon.  

## 2021-12-19 NOTE — Evaluation (Signed)
Physical Therapy Evaluation Patient Details Name: Isaac Lozano. MRN: [id number] DOB: [birth year] Today's Date: 12/19/2021  History of Present Illness  Pt is a 66yo male presentign s/p L-TKA on 12/19/21. PMH: DM, GERD, HTN, scoliosis, lumbar radiculopathy.  Clinical Impression  Isaac Juanya Villavicencio. is a 66 y.o. male POD 0 s/p L-TKA. Patient reports modified independence using quad cane for mobility at baseline. Patient is now limited by functional impairments (see PT problem list below) and requires supervision for bed mobility and min guard for transfers. Patient was able to ambulate 12 feet with RW and min guard level of assist; pt reporting  mild dizziness post-ambulation, BP 197/84 RN notified, further mobility deferred. Patient instructed in exercise to facilitate ROM and circulation to manage edema. Provided incentive spirometer and with Vcs pt able to achieve . Patient will benefit from continued skilled PT interventions to address impairments and progress towards PLOF. Acute PT will follow to progress mobility and stair training in preparation for safe discharge home.       Recommendations for follow up therapy are one component of a multi-disciplinary discharge planning process, led by the attending physician.  Recommendations may be updated based on patient status, additional functional criteria and insurance authorization.  Follow Up Recommendations Follow physician's recommendations for discharge plan and follow up therapies      Assistance Recommended at Discharge Intermittent Supervision/Assistance  Patient can return home with the following  A little help with walking and/or transfers;A little help with bathing/dressing/bathroom;Assistance with cooking/housework;Assist for transportation;Help with stairs or ramp for entrance    Equipment Recommendations None recommended by PT (Pt has recommended DME)  Recommendations for Other Services       Functional Status Assessment  Patient has had a recent decline in their functional status and demonstrates the ability to make significant improvements in function in a reasonable and predictable amount of time.     Precautions / Restrictions Precautions Precautions: Fall Restrictions Weight Bearing Restrictions: No Other Position/Activity Restrictions: WBAT      Mobility  Bed Mobility Overal bed mobility: Needs Assistance Bed Mobility: Supine to Sit     Supine to sit: Supervision     General bed mobility comments: For safety only, no physical assist required    Transfers Overall transfer level: Needs assistance Equipment used: Rolling walker (2 wheels) Transfers: Sit to/from Stand Sit to Stand: Min guard           General transfer comment: For safety only, multimodal cues for sequencing and powering up through BUEs    Ambulation/Gait Ambulation/Gait assistance: Min guard Gait Distance (Feet): 12 Feet Assistive device: Rolling walker (2 wheels) Gait Pattern/deviations: Step-to pattern Gait velocity: decreased     General Gait Details: Pt ambulated with RW and min guard, no physical assist required or overt LOB noted. Pt reporting some "whooziness" so BP taken 197/84, HR 85, RN notified further mobility deferred.  Stairs            Wheelchair Mobility    Modified Rankin (Stroke Patients Only)       Balance Overall balance assessment: Needs assistance Sitting-balance support: Feet supported, No upper extremity supported Sitting balance-Leahy Scale: Fair     Standing balance support: Reliant on assistive device for balance, During functional activity, Bilateral upper extremity supported Standing balance-Leahy Scale: Poor                               Pertinent Vitals/Pain  Pain Assessment Pain Assessment: 0-10 Pain Score: 8  Pain Location: left knee Pain Descriptors / Indicators: Operative site guarding, Burning Pain Intervention(s): Limited activity within  patient's tolerance, Monitored during session, Repositioned, Ice applied    Home Living Family/patient expects to be discharged to:: Private residence Living Arrangements: Alone Available Help at Discharge: Family;Available 24 hours/day Type of Home: House Home Access: Level entry       Home Layout: One level Home Equipment: Rollator (4 wheels);Rolling Walker (2 wheels);Cane - quad Additional Comments: Home environment pertains to friend's home where pt will be staying immediatey post-discharge.    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)             Mobility Comments: Pt uses quad cane ADLs Comments: IND     Hand Dominance        Extremity/Trunk Assessment   Upper Extremity Assessment Upper Extremity Assessment: Overall WFL for tasks assessed    Lower Extremity Assessment Lower Extremity Assessment: RLE deficits/detail;LLE deficits/detail RLE Deficits / Details: MMT ank DF/PF 5/5 RLE Sensation: history of peripheral neuropathy LLE Deficits / Details: MMT ank DF/PF 5/5, no extensor lag noted LLE Sensation: WNL    Cervical / Trunk Assessment Cervical / Trunk Assessment: Other exceptions Cervical / Trunk Exceptions: hx of scoliosis  Communication   Communication: No difficulties  Cognition Arousal/Alertness: Awake/alert Behavior During Therapy: Anxious Overall Cognitive Status: Within Functional Limits for tasks assessed                                 General Comments: Pt appears to be anxious and nervous, very talkative.        General Comments      Exercises Total Joint Exercises Ankle Circles/Pumps: AROM, Both, 10 reps   Assessment/Plan    PT Assessment Patient needs continued PT services  PT Problem List Decreased strength;Decreased range of motion;Decreased activity tolerance;Decreased balance;Decreased mobility;Decreased coordination;Decreased  knowledge of use of DME;Pain       PT Treatment Interventions DME instruction;Gait training;Stair training;Functional mobility training;Therapeutic activities;Therapeutic exercise;Balance training;Patient/family education;Neuromuscular re-education    PT Goals (Current goals can be found in the Care Plan section)  Acute Rehab PT Goals Patient Stated Goal: To go back to wrok PT Goal Formulation: With patient Time For Goal Achievement: 12/26/21 Potential to Achieve Goals: Good    Frequency 7X/week     Co-evaluation               AM-PAC PT "6 Clicks" Mobility  Outcome Measure Help needed turning from your back to your side while in a flat bed without using bedrails?: None Help needed moving from lying on your back to sitting on the side of a flat bed without using bedrails?: A Little Help needed moving to and from a bed to a chair (including a wheelchair)?: A Little Help needed standing up from a chair using your arms (e.g., wheelchair or bedside chair)?: A Little Help needed to walk in hospital room?: A Little Help needed climbing 3-5 steps with a railing? : A Little 6 Click Score: 19    End of Session Equipment Utilized During Treatment: Gait belt Activity Tolerance: Patient tolerated treatment well;No increased pain Patient left: in chair;with call bell/phone within reach;with chair alarm set;with SCD's reapplied Nurse Communication: Mobility status;Other (comment) (Vitals) PT Visit Diagnosis: Pain;Difficulty in walking, not elsewhere classified (R26.2) Pain - Right/Left: Left Pain - part  of body: Knee    Time: 7619-5093 PT Time Calculation (min) (ACUTE ONLY): 39 min   Charges:   PT Evaluation $PT Eval Low Complexity: 1 Low PT Treatments $Gait Training: 8-22 mins $Self Care/Home Management: 8-22        Jamesetta Geralds, PT, DPT [facility] Department Office: [phone] Pager: [phone]  Jamesetta Geralds 12/19/2021, 6:45 PM

## 2021-12-19 NOTE — Anesthesia Procedure Notes (Signed)
Procedure Name: Intubation Date/Time: 12/19/2021 9:00 AM  Performed by: Lavina Hamman, CRNAPre-anesthesia Checklist: Patient identified, Emergency Drugs available, Suction available, Patient being monitored and Timeout performed Patient Re-evaluated:Patient Re-evaluated prior to induction Oxygen Delivery Method: Circle system utilized Preoxygenation: Pre-oxygenation with 100% oxygen Induction Type: IV induction Ventilation: Mask ventilation without difficulty Laryngoscope Size: Mac and 3 Grade View: Grade I Tube type: Oral Tube size: 7.0 mm Number of attempts: 1 Airway Equipment and Method: Stylet Placement Confirmation: ETT inserted through vocal cords under direct vision, positive ETCO2, CO2 detector and breath sounds checked- equal and bilateral Secured at: 21 cm Tube secured with: Tape Dental Injury: Teeth and Oropharynx as per pre-operative assessment  Comments: ATOI

## 2021-12-19 NOTE — Transfer of Care (Signed)
Immediate Anesthesia Transfer of Care Note  Patient: Isaac Lozano.  Procedure(s) Performed: Procedure(s): COMPUTER ASSISTED TOTAL KNEE ARTHROPLASTY (Left)  Patient Location: PACU  Anesthesia Type:General  Level of Consciousness:  sedated, patient cooperative and responds to stimulation  Airway & Oxygen Therapy:Patient Spontanous Breathing and Patient connected to face mask oxgen  Post-op Assessment:  Report given to PACU RN and Post -op Vital signs reviewed and stable  Post vital signs:  Reviewed and stable  Last Vitals:  Vitals:   12/19/21 0659  BP: (!) 171/81  Pulse: 95  Resp: 18  Temp: 37.4 C  SpO2: 96%    Complications: No apparent anesthesia complications

## 2021-12-19 NOTE — Interval H&P Note (Signed)
History and Physical Interval Note:  12/19/2021 7:51 AM  Isaac Lozano.  has presented today for surgery, with the diagnosis of Left knee osteoarthritis.  The various methods of treatment have been discussed with the patient and family. After consideration of risks, benefits and other options for treatment, the patient has consented to  Procedure(s): COMPUTER ASSISTED TOTAL KNEE ARTHROPLASTY (Left) as a surgical intervention.  The patient's history has been reviewed, patient examined, no change in status, stable for surgery.  I have reviewed the patient's chart and labs.  Questions were answered to the patient's satisfaction.     Iline Oven Aravind Chrismer

## 2021-12-20 ENCOUNTER — Encounter (HOSPITAL_COMMUNITY): Payer: Self-pay | Admitting: Orthopedic Surgery

## 2021-12-20 DIAGNOSIS — M12562 Traumatic arthropathy, left knee: Secondary | ICD-10-CM | POA: Diagnosis not present

## 2021-12-20 LAB — CBC
HCT: 30.6 % — ABNORMAL LOW (ref 39.0–52.0)
Hemoglobin: 10 g/dL — ABNORMAL LOW (ref 13.0–17.0)
MCH: 30.4 pg (ref 26.0–34.0)
MCHC: 32.7 g/dL (ref 30.0–36.0)
MCV: 93 fL (ref 80.0–100.0)
Platelets: 208 10*3/uL (ref 150–400)
RBC: 3.29 MIL/uL — ABNORMAL LOW (ref 4.22–5.81)
RDW: 13 % (ref 11.5–15.5)
WBC: 11.3 10*3/uL — ABNORMAL HIGH (ref 4.0–10.5)
nRBC: 0 % (ref 0.0–0.2)

## 2021-12-20 LAB — BASIC METABOLIC PANEL
Anion gap: 7 (ref 5–15)
BUN: 12 mg/dL (ref 8–23)
CO2: 27 mmol/L (ref 22–32)
Calcium: 8.1 mg/dL — ABNORMAL LOW (ref 8.9–10.3)
Chloride: 106 mmol/L (ref 98–111)
Creatinine, Ser: 0.72 mg/dL (ref 0.61–1.24)
GFR, Estimated: >60 mL/min (ref >60)
Glucose, Bld: 125 mg/dL — ABNORMAL HIGH (ref 70–99)
Potassium: 3.7 mmol/L (ref 3.5–5.1)
Sodium: 140 mmol/L (ref 135–145)

## 2021-12-20 LAB — GLUCOSE, CAPILLARY
Glucose-Capillary: 117 mg/dL — ABNORMAL HIGH (ref 70–99)
Glucose-Capillary: 142 mg/dL — ABNORMAL HIGH (ref 70–99)
Glucose-Capillary: 144 mg/dL — ABNORMAL HIGH (ref 70–99)
Glucose-Capillary: 144 mg/dL — ABNORMAL HIGH (ref 70–99)

## 2021-12-20 MED ORDER — ONDANSETRON HCL 4 MG PO TABS: 4.0000 mg | ORAL_TABLET | Freq: Three times a day (TID) | ORAL | 0 refills | Status: AC | PRN

## 2021-12-20 MED ORDER — POLYETHYLENE GLYCOL 3350 17 G PO PACK: 17.0000 g | PACK | Freq: Every day | ORAL | 0 refills | Status: AC | PRN

## 2021-12-20 MED ORDER — ASPIRIN 81 MG PO CHEW: 81.0000 mg | CHEWABLE_TABLET | Freq: Two times a day (BID) | ORAL | 0 refills | Status: AC

## 2021-12-20 MED ORDER — DOCUSATE SODIUM 100 MG PO CAPS: 100.0000 mg | ORAL_CAPSULE | Freq: Two times a day (BID) | ORAL | 0 refills | Status: AC

## 2021-12-20 MED ORDER — HYDROCODONE-ACETAMINOPHEN 7.5-325 MG PO TABS: 1.0000 | ORAL_TABLET | ORAL | 0 refills | Status: AC | PRN

## 2021-12-20 MED ORDER — SENNA 8.6 MG PO TABS: 2.0000 | ORAL_TABLET | Freq: Every day | ORAL | 0 refills | Status: AC

## 2021-12-20 MED ORDER — METHOCARBAMOL 500 MG PO TABS: 500.0000 mg | ORAL_TABLET | Freq: Four times a day (QID) | ORAL | 0 refills | Status: AC | PRN

## 2021-12-20 NOTE — TOC Transition Note (Signed)
Transition of Care [facility]) - CM/SW Discharge Note   Patient Details  Name: Isaac Lozano. MRN: [id number] Date of Birth: Oct 08, 1955  [facility] CM/SW Contact:  Lennart Pall, LCSW Phone Number: 12/20/2021, 9:22 AM   Clinical Narrative:     Met briefly with pt and confirming he has all needed DME at home.   OPPT arranged with [facility].  No TOC needs.  Final next level of care: OP Rehab Barriers to Discharge: No Barriers Identified   Patient Goals and CMS Choice Patient states their goals for this hospitalization and ongoing recovery are:: return home      Discharge Placement                       Discharge Plan and Services                DME Arranged: N/A DME Agency: NA                  Social Determinants of Health (SDOH) Interventions     Readmission Risk Interventions     No data to display

## 2021-12-20 NOTE — Progress Notes (Signed)
Physical Therapy Treatment Patient Details Name: Isaac Lozano. MRN: [id number] DOB: May 28, 1956 Today's Date: 12/20/2021   History of Present Illness Pt is a 66yo male presentign s/p L-TKA on 12/19/21. PMH: DM, GERD, HTN, scoliosis, lumbar radiculopathy.    PT Comments    Pt up to mobilize increased (but still limited) distance and performed HEP with assist.  Pt continues limited by anxiety and c/o "whooziness" with OOB activity - BP 150/63, RN aware.   Recommendations for follow up therapy are one component of a multi-disciplinary discharge planning process, led by the attending physician.  Recommendations may be updated based on patient status, additional functional criteria and insurance authorization.  Follow Up Recommendations  Follow physician's recommendations for discharge plan and follow up therapies     Assistance Recommended at Discharge Intermittent Supervision/Assistance  Patient can return home with the following A little help with walking and/or transfers;A little help with bathing/dressing/bathroom;Assistance with cooking/housework;Assist for transportation;Help with stairs or ramp for entrance   Equipment Recommendations  None recommended by PT    Recommendations for Other Services       Precautions / Restrictions Precautions Precautions: Fall Restrictions Weight Bearing Restrictions: No Other Position/Activity Restrictions: WBAT     Mobility  Bed Mobility Overal bed mobility: Needs Assistance Bed Mobility: Supine to Sit     Supine to sit: Min guard     General bed mobility comments: For safety only, no physical assist required    Transfers Overall transfer level: Needs assistance Equipment used: Rolling walker (2 wheels) Transfers: Sit to/from Stand Sit to Stand: Min guard           General transfer comment: For safety only, multimodal cues for sequencing and powering up through BUEs    Ambulation/Gait Ambulation/Gait assistance: Min  guard Gait Distance (Feet): 50 Feet Assistive device: Rolling walker (2 wheels) Gait Pattern/deviations: Step-to pattern Gait velocity: decreased     General Gait Details: Pt ambulated with RW and min guard, no physical assist required or overt LOB noted. Pt reporting some "whooziness" so BP taken 150/63,   Stairs             Wheelchair Mobility    Modified Rankin (Stroke Patients Only)       Balance Overall balance assessment: Needs assistance Sitting-balance support: Feet supported, No upper extremity supported Sitting balance-Leahy Scale: Good     Standing balance support: Reliant on assistive device for balance, During functional activity, Bilateral upper extremity supported Standing balance-Leahy Scale: Poor                              Cognition Arousal/Alertness: Awake/alert Behavior During Therapy: Anxious Overall Cognitive Status: Within Functional Limits for tasks assessed                                 General Comments: Pt appears to be anxious and nervous, very talkative.        Exercises Total Joint Exercises Ankle Circles/Pumps: AROM, Both, 10 reps Quad Sets: AROM, Both, 10 reps, Supine Heel Slides: AAROM, Left, 15 reps, Supine Straight Leg Raises: AAROM, Left, 15 reps, Supine Goniometric ROM: AAROM L knee -5 - 30    General Comments        Pertinent Vitals/Pain Pain Assessment Pain Assessment: 0-10 Pain Score: 8  Pain Location: left knee Pain Descriptors / Indicators: Operative site guarding, Burning Pain Intervention(s): Limited  activity within patient's tolerance, Monitored during session, Premedicated before session, Ice applied    Home Living                          Prior Function            PT Goals (current goals can now be found in the care plan section) Acute Rehab PT Goals Patient Stated Goal: To go back to work PT Goal Formulation: With patient Time For Goal Achievement:  12/26/21 Potential to Achieve Goals: Good Progress towards PT goals: Progressing toward goals    Frequency    7X/week      PT Plan Current plan remains appropriate    Co-evaluation              AM-PAC PT "6 Clicks" Mobility   Outcome Measure  Help needed turning from your back to your side while in a flat bed without using bedrails?: None Help needed moving from lying on your back to sitting on the side of a flat bed without using bedrails?: A Little Help needed moving to and from a bed to a chair (including a wheelchair)?: A Little Help needed standing up from a chair using your arms (e.g., wheelchair or bedside chair)?: A Little Help needed to walk in hospital room?: A Little Help needed climbing 3-5 steps with a railing? : A Little 6 Click Score: 19    End of Session Equipment Utilized During Treatment: Gait belt Activity Tolerance: Patient tolerated treatment well;No increased pain Patient left: in chair;with call bell/phone within reach;with chair alarm set;with SCD's reapplied Nurse Communication: Mobility status PT Visit Diagnosis: Pain;Difficulty in walking, not elsewhere classified (R26.2) Pain - Right/Left: Left Pain - part of body: Knee     Time: 0822-0907 PT Time Calculation (min) (ACUTE ONLY): 45 min  Charges:  $Gait Training: 8-22 mins $Therapeutic Exercise: 8-22 mins $Therapeutic Activity: 8-22 mins                     Mauro Kaufmann PT Acute Rehabilitation Services Pager [phone] Office [phone]    Clarinda Obi 12/20/2021, 12:17 PM

## 2021-12-20 NOTE — Progress Notes (Addendum)
    Subjective:  Patient reports pain as mild to moderate.  Currently denies V/CP/SOB/Abd pain. He states he has been a little nauseous overnight but did not tell the nurse or have nausea medication. We discussed to take the medication as needed so he feels better. Patient stated he didn't get much sleep last night. He reports being anxious about what is going on.  He was worried about his medication at discharge and not knowing what to do when he got home. We discussed we reviewed all of this already at his preoperative appointment and we went over it again today. We discussed he will have written instructions and will be okay. Lots of reassurance provided.   He stated he did well with physical therapy yesterday.   Objective:   VITALS:   Vitals:   12/19/21 1600 12/19/21 2110 12/20/21 0110 12/20/21 0549  BP: (!) 162/75 (!) 159/79 (!) 161/70 130/63  Pulse: 73 84 88 94  Resp: 18 17 18 18   Temp: 98.3 F (36.8 C) 98.6 F (37 C) 98.8 F (37.1 C) 98.6 F (37 C)  TempSrc: Oral Oral Oral Oral  SpO2: 95% 97% 95% 100%  Weight:      Height:        Patient is lying in bed. NAD.  Neurologically intact ABD soft Neurovascular intact Sensation intact distally Intact pulses distally Dorsiflexion/Plantar flexion intact Incision: dressing C/D/I No cellulitis present Compartment soft   Lab Results  Component Value Date   WBC 11.3 (H) 12/20/2021   HGB 10.0 (L) 12/20/2021   HCT 30.6 (L) 12/20/2021   MCV 93.0 12/20/2021   PLT 208 12/20/2021   BMET    Component Value Date/Time   NA 140 12/20/2021 0340   K 3.7 12/20/2021 0340   CL 106 12/20/2021 0340   CO2 27 12/20/2021 0340   GLUCOSE 125 (H) 12/20/2021 0340   BUN 12 12/20/2021 0340   CREATININE 0.72 12/20/2021 0340   CALCIUM 8.1 (L) 12/20/2021 0340   GFRNONAA >60 12/20/2021 0340     Assessment/Plan: 1 Day Post-Op   Principal Problem:   Traumatic arthritis of left knee   WBAT with walker DVT ppx: Aspirin, SCDs, TEDS PO  pain control PT/OT: He ambulated 12 feet with PT yesterday. PT to come by again today.  Dispo: Patient to be discharged home once he clears physical therapy.    12/22/2021, PA-C 12/20/2021, 10:50 AM  [facility]  [facility] [address], [address], [place], [place]

## 2021-12-20 NOTE — Progress Notes (Signed)
Physical Therapy Treatment Patient Details Name: Isaac Lozano. MRN: [id number] DOB: [birth year] Today's Date: 12/20/2021   History of Present Illness Pt is a 66yo male presentign s/p L-TKA on 12/19/21. PMH: DM, GERD, HTN, scoliosis, lumbar radiculopathy.    PT Comments    Pt continues cooperative but limited by c/o pain, nausea and "whooziness" with activity - BP 167/75.  RN aware.  Recommendations for follow up therapy are one component of a multi-disciplinary discharge planning process, led by the attending physician.  Recommendations may be updated based on patient status, additional functional criteria and insurance authorization.  Follow Up Recommendations  Follow physician's recommendations for discharge plan and follow up therapies     Assistance Recommended at Discharge Intermittent Supervision/Assistance  Patient can return home with the following A little help with walking and/or transfers;A little help with bathing/dressing/bathroom;Assistance with cooking/housework;Assist for transportation;Help with stairs or ramp for entrance   Equipment Recommendations  None recommended by PT    Recommendations for Other Services       Precautions / Restrictions Precautions Precautions: Fall Restrictions Weight Bearing Restrictions: No Other Position/Activity Restrictions: WBAT     Mobility  Bed Mobility Overal bed mobility: Needs Assistance Bed Mobility: Sit to Supine     Supine to sit: Min guard Sit to supine: Min assist   General bed mobility comments: cues for sequence with assist for L LE    Transfers Overall transfer level: Needs assistance Equipment used: Rolling walker (2 wheels) Transfers: Sit to/from Stand Sit to Stand: Min guard           General transfer comment: For safety only, multimodal cues for sequencing and powering up through BUEs    Ambulation/Gait Ambulation/Gait assistance: Min guard Gait Distance (Feet): 54 Feet Assistive device:  Rolling walker (2 wheels) Gait Pattern/deviations: Step-to pattern Gait velocity: decreased     General Gait Details: Pt ambulated with RW and min guard, no physical assist required or overt LOB noted. Pt reporting some "whooziness" so BP taken 167/75,   Stairs             Wheelchair Mobility    Modified Rankin (Stroke Patients Only)       Balance Overall balance assessment: Needs assistance Sitting-balance support: Feet supported, No upper extremity supported Sitting balance-Leahy Scale: Good     Standing balance support: Reliant on assistive device for balance, During functional activity, Bilateral upper extremity supported Standing balance-Leahy Scale: Poor                              Cognition Arousal/Alertness: Awake/alert Behavior During Therapy: Anxious Overall Cognitive Status: Within Functional Limits for tasks assessed                                 General Comments: Pt appears to be anxious and nervous, very talkative.        Exercises Total Joint Exercises Ankle Circles/Pumps: AROM, Both, 10 reps Quad Sets: AROM, Both, 10 reps, Supine Heel Slides: AAROM, Left, 15 reps, Supine Straight Leg Raises: AAROM, Left, 15 reps, Supine Goniometric ROM: AAROM L knee -5 - 30    General Comments        Pertinent Vitals/Pain Pain Assessment Pain Assessment: 0-10 Pain Score: 8  Pain Location: left knee Pain Descriptors / Indicators: Operative site guarding, Burning Pain Intervention(s): Limited activity within patient's tolerance, Monitored during session, Premedicated before  session    Home Living                          Prior Function            PT Goals (current goals can now be found in the care plan section) Acute Rehab PT Goals Patient Stated Goal: To go back to work PT Goal Formulation: With patient Time For Goal Achievement: 12/26/21 Potential to Achieve Goals: Good Progress towards PT goals:  Progressing toward goals    Frequency    7X/week      PT Plan Current plan remains appropriate    Co-evaluation              AM-PAC PT "6 Clicks" Mobility   Outcome Measure  Help needed turning from your back to your side while in a flat bed without using bedrails?: None Help needed moving from lying on your back to sitting on the side of a flat bed without using bedrails?: A Little Help needed moving to and from a bed to a chair (including a wheelchair)?: A Little Help needed standing up from a chair using your arms (e.g., wheelchair or bedside chair)?: A Little Help needed to walk in hospital room?: A Little Help needed climbing 3-5 steps with a railing? : A Little 6 Click Score: 19    End of Session Equipment Utilized During Treatment: Gait belt Activity Tolerance: Other (comment) (nausea) Patient left: in bed;with call bell/phone within reach;with bed alarm set Nurse Communication: Mobility status PT Visit Diagnosis: Pain;Difficulty in walking, not elsewhere classified (R26.2) Pain - Right/Left: Left Pain - part of body: Knee     Time: 7412-8786 PT Time Calculation (min) (ACUTE ONLY): 34 min  Charges:  $Gait Training: 8-22 mins $Therapeutic Exercise: 8-22 mins                     Mauro Kaufmann PT Acute Rehabilitation Services Pager [phone] Office [phone]    Ryden Wainer 12/20/2021, 4:27 PM

## 2021-12-21 DIAGNOSIS — M12562 Traumatic arthropathy, left knee: Secondary | ICD-10-CM | POA: Diagnosis not present

## 2021-12-21 LAB — CBC
HCT: 28.7 % — ABNORMAL LOW (ref 39.0–52.0)
Hemoglobin: 9.4 g/dL — ABNORMAL LOW (ref 13.0–17.0)
MCH: 29.8 pg (ref 26.0–34.0)
MCHC: 32.8 g/dL (ref 30.0–36.0)
MCV: 91.1 fL (ref 80.0–100.0)
Platelets: 213 10*3/uL (ref 150–400)
RBC: 3.15 MIL/uL — ABNORMAL LOW (ref 4.22–5.81)
RDW: 13 % (ref 11.5–15.5)
WBC: 11.2 10*3/uL — ABNORMAL HIGH (ref 4.0–10.5)
nRBC: 0 % (ref 0.0–0.2)

## 2021-12-21 LAB — GLUCOSE, CAPILLARY
Glucose-Capillary: 140 mg/dL — ABNORMAL HIGH (ref 70–99)
Glucose-Capillary: 145 mg/dL — ABNORMAL HIGH (ref 70–99)

## 2021-12-21 MED ORDER — ACETAMINOPHEN 325 MG PO TABS: 650.0000 mg | ORAL_TABLET | Freq: Three times a day (TID) | ORAL | 0 refills | Status: AC | PRN

## 2021-12-21 NOTE — Progress Notes (Signed)
Physical Therapy Treatment Patient Details Name: Isaac Lozano. MRN: [id number] DOB: November 07, 1955 Today's Date: 12/21/2021   History of Present Illness Pt is a 66yo male presentign s/p L-TKA on 12/19/21. PMH: DM, GERD, HTN, scoliosis, lumbar radiculopathy.    PT Comments    Returned for stair training.  Pt reports friend's home has two steps to enter with hand rail on either one side or both at garage entry.  Pt ambulated limited distance to hall and negotiated stairs with min difficulty.  Pt reviewed HEP and states seems clear to him. Multiple questions asked and answered.  Pt feeling much more ready to dc this date.   Recommendations for follow up therapy are one component of a multi-disciplinary discharge planning process, led by the attending physician.  Recommendations may be updated based on patient status, additional functional criteria and insurance authorization.  Follow Up Recommendations  Follow physician's recommendations for discharge plan and follow up therapies     Assistance Recommended at Discharge Intermittent Supervision/Assistance  Patient can return home with the following A little help with walking and/or transfers;A little help with bathing/dressing/bathroom;Assistance with cooking/housework;Assist for transportation;Help with stairs or ramp for entrance   Equipment Recommendations  None recommended by PT    Recommendations for Other Services       Precautions / Restrictions Precautions Precautions: Fall;Knee Restrictions Weight Bearing Restrictions: No Other Position/Activity Restrictions: WBAT     Mobility  Bed Mobility Overal bed mobility: Needs Assistance Bed Mobility: Supine to Sit     Supine to sit: Supervision     General bed mobility comments: Pt up in chair and requests back to same for bathing    Transfers Overall transfer level: Needs assistance Equipment used: Rolling walker (2 wheels) Transfers: Sit to/from Stand Sit to Stand:  Supervision           General transfer comment: min cues for technique    Ambulation/Gait Ambulation/Gait assistance: Supervision Gait Distance (Feet): 70 Feet Assistive device: Rolling walker (2 wheels) Gait Pattern/deviations: Step-to pattern Gait velocity: decreased     General Gait Details: cues for posture and position from RW   Stairs Stairs: Yes Stairs assistance: Min assist Stair Management: One rail Left, Step to pattern, Forwards, With cane Number of Stairs: 5 General stair comments: 2+3 stairs with cane and rail; cues for sequence and foot cane placement   Wheelchair Mobility    Modified Rankin (Stroke Patients Only)       Balance Overall balance assessment: Needs assistance Sitting-balance support: Feet supported, No upper extremity supported Sitting balance-Leahy Scale: Good     Standing balance support: No upper extremity supported Standing balance-Leahy Scale: Fair                              Cognition Arousal/Alertness: Awake/alert Behavior During Therapy: Anxious Overall Cognitive Status: Within Functional Limits for tasks assessed                                          Exercises Total Joint Exercises Ankle Circles/Pumps: AROM, Both, 15 reps, Supine Quad Sets: AROM, Both, 10 reps, Supine Heel Slides: AAROM, Left, Supine, 20 reps Straight Leg Raises: AAROM, Left, Supine, 20 reps Goniometric ROM: AAROM L knee -5 - 25    General Comments        Pertinent Vitals/Pain Pain Assessment Pain Assessment:  0-10 Pain Score: 7  Pain Location: left knee Pain Descriptors / Indicators: Sore, Aching, Burning Pain Intervention(s): Limited activity within patient's tolerance, Monitored during session, Premedicated before session, Ice applied    Home Living                          Prior Function            PT Goals (current goals can now be found in the care plan section) Acute Rehab PT  Goals Patient Stated Goal: To go back to work PT Goal Formulation: With patient Time For Goal Achievement: 12/26/21 Potential to Achieve Goals: Good Progress towards PT goals: Progressing toward goals    Frequency    7X/week      PT Plan Current plan remains appropriate    Co-evaluation              AM-PAC PT "6 Clicks" Mobility   Outcome Measure  Help needed turning from your back to your side while in a flat bed without using bedrails?: None Help needed moving from lying on your back to sitting on the side of a flat bed without using bedrails?: A Little Help needed moving to and from a bed to a chair (including a wheelchair)?: A Little Help needed standing up from a chair using your arms (e.g., wheelchair or bedside chair)?: A Little Help needed to walk in hospital room?: A Little Help needed climbing 3-5 steps with a railing? : A Little 6 Click Score: 19    End of Session Equipment Utilized During Treatment: Gait belt Activity Tolerance: Patient tolerated treatment well Patient left: in chair;with call bell/phone within reach;with chair alarm set Nurse Communication: Mobility status PT Visit Diagnosis: Pain;Difficulty in walking, not elsewhere classified (R26.2) Pain - Right/Left: Left Pain - part of body: Knee     Time: 1224-8250 PT Time Calculation (min) (ACUTE ONLY): 28 min  Charges:  $Gait Training: 8-22 mins $Therapeutic Exercise: 8-22 mins $Therapeutic Activity: 8-22 mins                     Isaac Lozano PT Acute Rehabilitation Services Pager [phone] Office [phone]    Isaac Lozano 12/21/2021, 12:34 PM

## 2021-12-21 NOTE — Progress Notes (Addendum)
    Subjective:  Patient reports pain as mild to moderate.  Denies N/V/CP/SOB/Abd pain. He states he is feeling much better than yesterday. He worked with PT yesterday and walked over 50 feet. He is not requiring pain medication only tylenol and celebrex. He denies any tingling and numbness in LE bilaterally.   He remains very anxious in general. He was very anxious before his surgery as well. Lots of reassurance provided.  Objective:   VITALS:   Vitals:   12/20/21 1417 12/20/21 2145 12/21/21 0106 12/21/21 0510  BP: (!) 147/68 (!) 148/54 (!) 142/64 125/61  Pulse: 89 100 94 87  Resp: 18 17 17 18   Temp: 99.4 F (37.4 C) (!) 100.6 F (38.1 C) 99.3 F (37.4 C) 98.9 F (37.2 C)  TempSrc: Oral Oral Oral Oral  SpO2: 94% 94% 99% 98%  Weight:      Height:        Patient lying in bed. NAD. Neurologically intact ABD soft Neurovascular intact Sensation intact distally Intact pulses distally Dorsiflexion/Plantar flexion intact No cellulitis present Compartment soft Aquacel dressing C/D/I. Scant drainage at distal incision where fluid blister is.   Lab Results  Component Value Date   WBC 11.2 (H) 12/21/2021   HGB 9.4 (L) 12/21/2021   HCT 28.7 (L) 12/21/2021   MCV 91.1 12/21/2021   PLT 213 12/21/2021   BMET    Component Value Date/Time   NA 140 12/20/2021 0340   K 3.7 12/20/2021 0340   CL 106 12/20/2021 0340   CO2 27 12/20/2021 0340   GLUCOSE 125 (H) 12/20/2021 0340   BUN 12 12/20/2021 0340   CREATININE 0.72 12/20/2021 0340   CALCIUM 8.1 (L) 12/20/2021 0340   GFRNONAA >60 12/20/2021 0340     Assessment/Plan: 2 Days Post-Op   Principal Problem:   Traumatic arthritis of left knee   WBAT with walker DVT ppx: Aspirin, SCDs, TEDS PO pain control: He has not been requiring any narcotic pain medication in the last 24 hours, only tylenol and celebrex.  PT/OT: Patient ambulated 50 feet x2 with therapy yesterday. He states he was woozie and anxious but did well.  Dispo:  D/c home today with OPPT starting Monday once he clears PT.     Sunday, PA-C 12/21/2021, 7:11 AM  [facility]  [facility] [address], [address], [place], [place] Phone: [phone] [email or web address] Facebook  [phone]

## 2021-12-21 NOTE — Progress Notes (Signed)
Physical Therapy Treatment Patient Details Name: Isaac Lozano. MRN: [id number] DOB: [birth year] Today's Date: 12/21/2021   History of Present Illness Pt is a 66yo male presentign s/p L-TKA on 12/19/21. PMH: DM, GERD, HTN, scoliosis, lumbar radiculopathy.    PT Comments    Pt feeling much better this date with min c/o nausea and dizziness.  Pt up to ambulate increased distance in hall, exited bed unassisted and performed HEP with written instruction provided.  Pt now reveals need to do several steps at friends residence.  Will return after bfast to complete stair training.   Recommendations for follow up therapy are one component of a multi-disciplinary discharge planning process, led by the attending physician.  Recommendations may be updated based on patient status, additional functional criteria and insurance authorization.  Follow Up Recommendations  Follow physician's recommendations for discharge plan and follow up therapies     Assistance Recommended at Discharge Intermittent Supervision/Assistance  Patient can return home with the following A little help with walking and/or transfers;A little help with bathing/dressing/bathroom;Assistance with cooking/housework;Assist for transportation;Help with stairs or ramp for entrance   Equipment Recommendations  None recommended by PT    Recommendations for Other Services       Precautions / Restrictions Precautions Precautions: Fall;Knee Restrictions Weight Bearing Restrictions: No Other Position/Activity Restrictions: WBAT     Mobility  Bed Mobility Overal bed mobility: Needs Assistance Bed Mobility: Supine to Sit     Supine to sit: Supervision     General bed mobility comments: no physical assist    Transfers Overall transfer level: Needs assistance Equipment used: Rolling walker (2 wheels) Transfers: Sit to/from Stand Sit to Stand: Supervision           General transfer comment: min cues for technique     Ambulation/Gait Ambulation/Gait assistance: Min guard, Supervision Gait Distance (Feet): 100 Feet Assistive device: Rolling walker (2 wheels) Gait Pattern/deviations: Step-to pattern Gait velocity: decreased     General Gait Details: cues for posture and position from RW   Stairs             Wheelchair Mobility    Modified Rankin (Stroke Patients Only)       Balance Overall balance assessment: Needs assistance Sitting-balance support: Feet supported, No upper extremity supported Sitting balance-Leahy Scale: Good     Standing balance support: No upper extremity supported Standing balance-Leahy Scale: Fair                              Cognition Arousal/Alertness: Awake/alert Behavior During Therapy: Anxious Overall Cognitive Status: Within Functional Limits for tasks assessed                                          Exercises Total Joint Exercises Ankle Circles/Pumps: AROM, Both, 15 reps, Supine Quad Sets: AROM, Both, 10 reps, Supine Heel Slides: AAROM, Left, Supine, 20 reps Straight Leg Raises: AAROM, Left, Supine, 20 reps Goniometric ROM: AAROM L knee -5 - 25    General Comments        Pertinent Vitals/Pain Pain Assessment Pain Assessment: 0-10 Pain Score: 8  Pain Location: left knee Pain Descriptors / Indicators: Sore, Aching, Burning Pain Intervention(s): Limited activity within patient's tolerance, Monitored during session, Premedicated before session, Ice applied    Home Living  Prior Function            PT Goals (current goals can now be found in the care plan section) Acute Rehab PT Goals Patient Stated Goal: To go back to work PT Goal Formulation: With patient Time For Goal Achievement: 12/26/21 Potential to Achieve Goals: Good Progress towards PT goals: Progressing toward goals    Frequency    7X/week      PT Plan Current plan remains appropriate     Co-evaluation              AM-PAC PT "6 Clicks" Mobility   Outcome Measure  Help needed turning from your back to your side while in a flat bed without using bedrails?: None Help needed moving from lying on your back to sitting on the side of a flat bed without using bedrails?: A Little Help needed moving to and from a bed to a chair (including a wheelchair)?: A Little Help needed standing up from a chair using your arms (e.g., wheelchair or bedside chair)?: A Little Help needed to walk in hospital room?: A Little Help needed climbing 3-5 steps with a railing? : A Little 6 Click Score: 19    End of Session Equipment Utilized During Treatment: Gait belt Activity Tolerance: Patient tolerated treatment well Patient left: in chair;with call bell/phone within reach;with chair alarm set Nurse Communication: Mobility status PT Visit Diagnosis: Pain;Difficulty in walking, not elsewhere classified (R26.2) Pain - Right/Left: Left Pain - part of body: Knee     Time: 7425-9563 PT Time Calculation (min) (ACUTE ONLY): 35 min  Charges:  $Gait Training: 8-22 mins $Therapeutic Exercise: 8-22 mins                     Mauro Kaufmann PT Acute Rehabilitation Services Pager [phone] Office [phone]    Maddelyn Rocca 12/21/2021, 9:20 AM

## 2021-12-21 NOTE — Progress Notes (Signed)
The patient is alert and oriented and has been seen by his physician. The orders for discharge were written. IV has been removed. Went over discharge instructions with patient. He is being discharged via wheelchair with all of his belongings.
